# Patient Record
Sex: Female | Born: 1945
Health system: Southern US, Community
[De-identification: ages and names within clinical notes are randomized; demographics above are authoritative.]

## PROBLEM LIST (undated history)

## (undated) DIAGNOSIS — E119 Type 2 diabetes mellitus without complications: Secondary | ICD-10-CM

## (undated) HISTORY — DX: Type 2 diabetes mellitus without complications: E11.9

## (undated) HISTORY — PX: ABDOMINAL HYSTERECTOMY: SHX81

---

## 1999-06-09 ENCOUNTER — Encounter (INDEPENDENT_AMBULATORY_CARE_PROVIDER_SITE_OTHER): Payer: Self-pay | Admitting: Specialist

## 1999-06-09 ENCOUNTER — Ambulatory Visit (HOSPITAL_COMMUNITY): Admission: RE | Admit: 1999-06-09 | Discharge: 1999-06-09 | Payer: Self-pay | Admitting: Internal Medicine

## 2000-05-16 ENCOUNTER — Encounter: Admission: RE | Admit: 2000-05-16 | Discharge: 2000-05-16 | Payer: Self-pay | Admitting: Obstetrics and Gynecology

## 2000-05-16 ENCOUNTER — Encounter: Payer: Self-pay | Admitting: Obstetrics and Gynecology

## 2001-05-22 ENCOUNTER — Encounter: Payer: Self-pay | Admitting: Obstetrics and Gynecology

## 2001-05-22 ENCOUNTER — Encounter: Admission: RE | Admit: 2001-05-22 | Discharge: 2001-05-22 | Payer: Self-pay | Admitting: Family Medicine

## 2001-11-14 ENCOUNTER — Inpatient Hospital Stay (HOSPITAL_COMMUNITY): Admission: RE | Admit: 2001-11-14 | Discharge: 2001-11-16 | Payer: Self-pay | Admitting: Gynecology

## 2001-11-14 ENCOUNTER — Encounter (INDEPENDENT_AMBULATORY_CARE_PROVIDER_SITE_OTHER): Payer: Self-pay | Admitting: Specialist

## 2002-05-24 ENCOUNTER — Encounter: Payer: Self-pay | Admitting: Gynecology

## 2002-05-24 ENCOUNTER — Encounter: Admission: RE | Admit: 2002-05-24 | Discharge: 2002-05-24 | Payer: Self-pay | Admitting: Gynecology

## 2003-01-23 ENCOUNTER — Other Ambulatory Visit: Admission: RE | Admit: 2003-01-23 | Discharge: 2003-01-23 | Payer: Self-pay | Admitting: Gynecology

## 2003-06-06 ENCOUNTER — Encounter: Admission: RE | Admit: 2003-06-06 | Discharge: 2003-06-06 | Payer: Self-pay | Admitting: Gynecology

## 2003-06-06 ENCOUNTER — Encounter: Payer: Self-pay | Admitting: Gynecology

## 2004-06-25 ENCOUNTER — Encounter: Admission: RE | Admit: 2004-06-25 | Discharge: 2004-06-25 | Payer: Self-pay | Admitting: Gynecology

## 2005-01-04 ENCOUNTER — Other Ambulatory Visit: Admission: RE | Admit: 2005-01-04 | Discharge: 2005-01-04 | Payer: Self-pay | Admitting: Gynecology

## 2005-07-06 ENCOUNTER — Encounter: Admission: RE | Admit: 2005-07-06 | Discharge: 2005-07-06 | Payer: Self-pay | Admitting: Gynecology

## 2006-02-18 ENCOUNTER — Ambulatory Visit: Payer: Self-pay | Admitting: Internal Medicine

## 2006-07-12 ENCOUNTER — Encounter: Admission: RE | Admit: 2006-07-12 | Discharge: 2006-07-12 | Payer: Self-pay | Admitting: Gynecology

## 2007-07-17 ENCOUNTER — Other Ambulatory Visit: Admission: RE | Admit: 2007-07-17 | Discharge: 2007-07-17 | Payer: Self-pay | Admitting: Gynecology

## 2007-07-19 ENCOUNTER — Encounter: Admission: RE | Admit: 2007-07-19 | Discharge: 2007-07-19 | Payer: Self-pay | Admitting: Gynecology

## 2008-07-19 ENCOUNTER — Encounter: Admission: RE | Admit: 2008-07-19 | Discharge: 2008-07-19 | Payer: Self-pay | Admitting: Gynecology

## 2009-07-21 ENCOUNTER — Encounter: Admission: RE | Admit: 2009-07-21 | Discharge: 2009-07-21 | Payer: Self-pay | Admitting: Gynecology

## 2009-09-11 ENCOUNTER — Other Ambulatory Visit: Admission: RE | Admit: 2009-09-11 | Discharge: 2009-09-11 | Payer: Self-pay | Admitting: Internal Medicine

## 2009-09-11 ENCOUNTER — Ambulatory Visit: Payer: Self-pay | Admitting: Internal Medicine

## 2009-09-16 ENCOUNTER — Encounter: Admission: RE | Admit: 2009-09-16 | Discharge: 2009-09-16 | Payer: Self-pay | Admitting: Internal Medicine

## 2009-10-14 ENCOUNTER — Ambulatory Visit: Payer: Self-pay | Admitting: Internal Medicine

## 2010-09-01 ENCOUNTER — Encounter: Admission: RE | Admit: 2010-09-01 | Discharge: 2010-09-01 | Payer: Self-pay | Admitting: Internal Medicine

## 2010-12-23 ENCOUNTER — Encounter: Payer: Self-pay | Admitting: Internal Medicine

## 2010-12-23 ENCOUNTER — Ambulatory Visit (INDEPENDENT_AMBULATORY_CARE_PROVIDER_SITE_OTHER): Payer: BC Managed Care – PPO | Admitting: Internal Medicine

## 2010-12-23 DIAGNOSIS — K089 Disorder of teeth and supporting structures, unspecified: Secondary | ICD-10-CM

## 2010-12-23 DIAGNOSIS — H6120 Impacted cerumen, unspecified ear: Secondary | ICD-10-CM

## 2010-12-23 DIAGNOSIS — J309 Allergic rhinitis, unspecified: Secondary | ICD-10-CM | POA: Insufficient documentation

## 2010-12-23 DIAGNOSIS — J069 Acute upper respiratory infection, unspecified: Secondary | ICD-10-CM | POA: Insufficient documentation

## 2010-12-25 ENCOUNTER — Telehealth (INDEPENDENT_AMBULATORY_CARE_PROVIDER_SITE_OTHER): Payer: Self-pay | Admitting: *Deleted

## 2010-12-29 NOTE — Assessment & Plan Note (Signed)
Summary: new acute/sinus//ok per kelly/kn   Vital Signs:  Patient profile:   65 year old female Height:      66.5 inches Weight:      183.2 pounds BMI:     29.23 Temp:     98.9 degrees F oral Pulse rate:   96 / minute Resp:     14 per minute BP sitting:   112 / 72  (left arm) Cuff size:   large  Vitals Entered By: Shonna Chock CMA (December 23, 2010 2:23 PM) CC: New Acute: Sinuses, patient to return for Establish/CPX appointment , URI symptoms   CC:  New Acute: Sinuses, patient to return for Establish/CPX appointment , and URI symptoms.  History of Present Illness:    Onset  as intermittent  pressure  in L ear  beginning several weeks ago followed by L  jaw pain. As of 12/21/2010   she develpoed  rhinitis; now she  reports clear nasal discharge and sore throat( last night), but denies productive cough.  Associated symptoms include low-grade fever (<100.5 degrees last night )also.  The patient denies dyspnea and wheezing.  The patient denies headache, bilateral nasal discharge and tender adenopathy. The jaw pain is localized to both the L TMJ & L mid mandible.  Rx: Sucrets  Preventive Screening-Counseling & Management  Alcohol-Tobacco     Smoking Status: never  Caffeine-Diet-Exercise     Does Patient Exercise: no  Allergies (verified): No Known Drug Allergies  Past History:  Past Medical History: Allergic rhinitis  Past Surgical History:  G 2 P 2 ; Hysterectomy for dysfunctional bleeding 2002, Dr Nicholas Lose  Family History: Father: diffuse alveolar lung disease following lung injury in MVA Mother: died age 43  Social History: Married Never Smoked Alcohol use-yes: socially Regular exercise-no Smoking Status:  never Does Patient Exercise:  no  Physical Exam  General:  well-nourished,in no acute distress; alert,appropriate and cooperative throughout examination Eyes:  No corneal or conjunctival inflammation noted. EOMI.  Vision grossly normal. Ears:  R ear normal and  L Cerumen impaction.  After gavage L TM clear & hearing normal to whisper. Nose:  External nasal examination shows no deformity but mild  inflammation. Nasal mucosa are pink and moist without lesions or exudates. Mouth:  Oral mucosa and oropharynx without lesions or exudates.  Teeth in good repair. Slight discomfort L TMJ with mastication Lungs:  Normal respiratory effort, chest expands symmetrically. Lungs are clear to auscultation, no crackles or wheezes. Heart:  Normal rate and regular rhythm. S1 and S2 normal without gallop, murmur, click, rub . S4 Cervical Nodes:  No lymphadenopathy noted Axillary Nodes:  No palpable lymphadenopathy   Impression & Recommendations:  Problem # 1:  CERUMEN IMPACTION, LEFT (ICD-380.4)  Problem # 2:  URI (ICD-465.9)  Problem # 3:  DENTAL PAIN (ICD-525.9) ? component of TMJ vs dental root infection  Complete Medication List: 1)  Vitamin D 1000 Unit Tabs (Cholecalciferol) .Marland Kitchen.. 1 by mouth once daily 2)  Citracal Plus Tabs (Multiple minerals-vitamins) .Marland Kitchen.. 1 by mouth once daily 3)  Amoxicillin 500 Mg Caps (Amoxicillin) .Marland Kitchen.. 1 three times a day  Patient Instructions: 1)  Soft diet & Aleve 1-2 every 8-12 hrs as needed for TMJ  jaw pain. Warm , moist compresses up to three times a day as needed . Prescriptions: AMOXICILLIN 500 MG CAPS (AMOXICILLIN) 1 three times a day  #30 x 0   Entered and Authorized by:   Marga Melnick MD   Signed by:  Marga Melnick MD on 12/23/2010   Method used:   Electronically to        [id number] (retail)       [address]       [place], [place]  [address]       Ph: [phone] or [phone]       Fax: [phone]   RxID:   [id number]    Orders Added: 1)  New Patient Level III [phone]

## 2010-12-29 NOTE — Progress Notes (Signed)
Summary: Triage: Cough  Phone Note Call from Patient Call back at Home Phone [phone] Call back at Work Phone [phone] Call back at [phone]   Caller: Patient Summary of Call: Patient would like a cough med call in because she has lost her voice from coughing all night last and Pharm--[facility] on [address] Initial call taken by: Freddy Jaksch,  December 25, 2010 1:08 PM  Follow-up for Phone Call        Dr.Hopper please advise  Follow-up by: Shonna Chock CMA,  December 25, 2010 3:28 PM  Additional Follow-up for Phone Call Additional follow up Details #1::        Per Dr.Hopper     New/Updated Medications: HYDROMET 5-1.5 MG/5ML SYRP (HYDROCODONE-HOMATROPINE) 1 teaspoon every 6 hours as needed Prescriptions: HYDROMET 5-1.5 MG/5ML SYRP (HYDROCODONE-HOMATROPINE) 1 teaspoon every 6 hours as needed  #120cc x 0   Entered by:   Shonna Chock CMA   Authorized by:   Marga Melnick MD   Signed by:   Shonna Chock CMA on 12/25/2010   Method used:   Print then Give to Patient   RxID:   [id number]

## 2011-01-27 ENCOUNTER — Telehealth: Payer: Self-pay | Admitting: Internal Medicine

## 2011-01-27 NOTE — Telephone Encounter (Signed)
Hop please advise for any other suggestions pt was seen 2/22

## 2011-01-27 NOTE — Telephone Encounter (Signed)
Spoke w/ pt aware of information appt scheduled to f/u

## 2011-01-27 NOTE — Telephone Encounter (Signed)
Patient says she saw Dr Alwyn Ren about a month ago for the cold and cough---she was given a prescription for a cough medicine---got better, but cough has come back---has been taking the cough medicine that Dr Alwyn Ren prescribed, but she feels like she needs an antibiotic this time.    Please call one into [facility], [address], [place]   In addition to home number [phone]) patient can also be reached on cell = [phone]

## 2011-01-27 NOTE — Telephone Encounter (Signed)
I reviewed the office visit from February 28. She was given an antibiotic at that time in addition to the cough medicine. The recurrence of symptoms is worrisome for reactive airways disease or variant  of asthma. This has been a common phenomena particularly with the recent pollen exposures. I recommend that she be reevaluated to see if an antibiotic is appropriate. She may need inhaled agents for reactive airways disease.

## 2011-01-28 ENCOUNTER — Encounter: Payer: Self-pay | Admitting: Internal Medicine

## 2011-01-29 ENCOUNTER — Encounter: Payer: Self-pay | Admitting: Internal Medicine

## 2011-01-29 ENCOUNTER — Ambulatory Visit (INDEPENDENT_AMBULATORY_CARE_PROVIDER_SITE_OTHER): Payer: BC Managed Care – PPO | Admitting: Internal Medicine

## 2011-01-29 VITALS — BP 114/80 | HR 76 | Temp 98.4°F | Wt 182.0 lb

## 2011-01-29 DIAGNOSIS — J209 Acute bronchitis, unspecified: Secondary | ICD-10-CM

## 2011-01-29 MED ORDER — AZITHROMYCIN 250 MG PO TABS: 250.0000 mg | ORAL_TABLET | Freq: Every day | ORAL | Status: AC

## 2011-01-29 NOTE — Progress Notes (Signed)
  Subjective:    Patient ID: Natalie [name], female    DOB: 26-Feb-1946, 65 y.o.   MRN: [id number]  HPI She started having a nonproductive cough on 01/23/2011. Intermittently she will have some clear sputum; there's been no purulent secretions. The major and minor symptoms of rhinosinusitis were reviewed. These include nasal congestion/obstruction; nasal purulence; facial pain; anosmia; fatigue; fever; headache; halitosis; earache and dental pain. None of these were present. Duration of symptoms  as well as treatment to date were verified.  She did have some itchy eyes and sneezing which responded to Sudafed.  She is a nonsmoker and  has no history of asthma. .     Review of Systems     Objective:   Physical Exam She appears healthy; she is in no distress.  There is no evidence of conjunctivitis  Nares are patent; there is no exudate. External nose is slightly erythematous.  Oropharynx is unremarkable. She has no exudate. Dental hygiene is good.  She has no lymphadenopathy about the neck or axilla.  Chest is essentially clear with only minimal rales. There is no wheezing or rhonchi noted. She does have intermittent dry cough.          Assessment & Plan:  #1 she has no evidence of purulent bronchitis; she possibly could have an atypical bacterial  infection with Mycoplasma.,Legionella, or non venereal Chlamydia.  She did have some extrinsic allergic symptoms; surprisingly these improved with a decongestant antihistamine.  I am concerned about possible reactive airways component postinflammatory.  Plan: As she has taken amoxicillin which would not cover the atypicals, a course of azithromycin will be prescribed.  I will also provide her with a sample of Singulair 10 mg( #7) and Advair 100/50 to be used to treat any extrinsic or inflammatory component.

## 2011-01-29 NOTE — Patient Instructions (Signed)
Please use the samples as prescribed. Advair 100/50 to be used one inhalation every 12 hours. Gargle with water and spit after use. This is to treat inflammation in  the airways. Singulair 10 mg is  to be taken once daily. This will treat both allergic components and cough itself.

## 2011-02-18 ENCOUNTER — Ambulatory Visit
Admission: RE | Admit: 2011-02-18 | Discharge: 2011-02-18 | Disposition: A | Discharge status: Home / Self Care | Payer: BC Managed Care – PPO | Source: Ambulatory Visit | Attending: Internal Medicine | Admitting: Internal Medicine

## 2011-02-18 ENCOUNTER — Ambulatory Visit (INDEPENDENT_AMBULATORY_CARE_PROVIDER_SITE_OTHER): Payer: BC Managed Care – PPO | Admitting: Internal Medicine

## 2011-02-18 ENCOUNTER — Other Ambulatory Visit: Payer: Self-pay | Admitting: Internal Medicine

## 2011-02-18 DIAGNOSIS — K529 Noninfective gastroenteritis and colitis, unspecified: Secondary | ICD-10-CM

## 2011-02-19 ENCOUNTER — Inpatient Hospital Stay (HOSPITAL_COMMUNITY)
Admission: AD | Admit: 2011-02-19 | Discharge: 2011-02-22 | DRG: 180 | Disposition: A | Discharge status: Home / Self Care | Payer: BC Managed Care – PPO | Source: Ambulatory Visit | Attending: Internal Medicine | Admitting: Internal Medicine

## 2011-02-19 ENCOUNTER — Other Ambulatory Visit: Payer: Self-pay | Admitting: Internal Medicine

## 2011-02-19 ENCOUNTER — Ambulatory Visit (INDEPENDENT_AMBULATORY_CARE_PROVIDER_SITE_OTHER): Payer: BC Managed Care – PPO | Admitting: Internal Medicine

## 2011-02-19 ENCOUNTER — Ambulatory Visit
Admission: RE | Admit: 2011-02-19 | Discharge: 2011-02-19 | Disposition: A | Discharge status: Home / Self Care | Payer: BC Managed Care – PPO | Source: Ambulatory Visit | Attending: Internal Medicine | Admitting: Internal Medicine

## 2011-02-19 DIAGNOSIS — E876 Hypokalemia: Secondary | ICD-10-CM | POA: Diagnosis present

## 2011-02-19 DIAGNOSIS — R112 Nausea with vomiting, unspecified: Secondary | ICD-10-CM | POA: Diagnosis present

## 2011-02-19 DIAGNOSIS — A088 Other specified intestinal infections: Secondary | ICD-10-CM | POA: Diagnosis present

## 2011-02-19 DIAGNOSIS — K56609 Unspecified intestinal obstruction, unspecified as to partial versus complete obstruction: Secondary | ICD-10-CM

## 2011-02-19 DIAGNOSIS — R140 Abdominal distension (gaseous): Secondary | ICD-10-CM

## 2011-02-19 DIAGNOSIS — K5289 Other specified noninfective gastroenteritis and colitis: Secondary | ICD-10-CM

## 2011-02-19 DIAGNOSIS — K529 Noninfective gastroenteritis and colitis, unspecified: Secondary | ICD-10-CM

## 2011-02-19 LAB — DIFFERENTIAL
Eosinophils Absolute: 0.2 10*3/uL (ref 0.0–0.7)
Eosinophils Relative: 3 % (ref 0–5)
Lymphocytes Relative: 30 % (ref 12–46)
Monocytes Relative: 12 % (ref 3–12)
Neutro Abs: 3.9 10*3/uL (ref 1.7–7.7)

## 2011-02-19 LAB — COMPREHENSIVE METABOLIC PANEL
ALT: 45 U/L — ABNORMAL HIGH (ref 0–35)
AST: 44 U/L — ABNORMAL HIGH (ref 0–37)
Albumin: 3.7 g/dL (ref 3.5–5.2)
Alkaline Phosphatase: 72 U/L (ref 39–117)
CO2: 28 mEq/L (ref 19–32)
Creatinine, Ser: 0.78 mg/dL (ref 0.4–1.2)
GFR calc Af Amer: >60 mL/min (ref >60)
GFR calc non Af Amer: >60 mL/min (ref >60)
Sodium: 139 mEq/L (ref 135–145)
Total Bilirubin: 0.9 mg/dL (ref 0.3–1.2)

## 2011-02-19 LAB — URINALYSIS, ROUTINE W REFLEX MICROSCOPIC
Bilirubin Urine: NEGATIVE
Specific Gravity, Urine: 1.02 (ref 1.005–1.030)
Urobilinogen, UA: 0.2 mg/dL (ref 0.0–1.0)
pH: 7 (ref 5.0–8.0)

## 2011-02-19 LAB — URINE MICROSCOPIC-ADD ON

## 2011-02-19 LAB — CBC
HCT: 40.2 % (ref 36.0–46.0)
Hemoglobin: 13.8 g/dL (ref 12.0–15.0)
MCH: 29.2 pg (ref 26.0–34.0)
MCHC: 34.3 g/dL (ref 30.0–36.0)
MCV: 85 fL (ref 78.0–100.0)
Platelets: 238 10*3/uL (ref 150–400)

## 2011-02-19 LAB — AMYLASE: Amylase: 50 U/L (ref 0–105)

## 2011-02-19 MED ORDER — IOHEXOL 300 MG/ML  SOLN
100.0000 mL | Freq: Once | INTRAMUSCULAR | Status: AC | PRN
  Administered 2011-02-19: 100 mL via INTRAVENOUS

## 2011-02-20 ENCOUNTER — Observation Stay (HOSPITAL_COMMUNITY): Payer: BC Managed Care – PPO

## 2011-02-20 DIAGNOSIS — K529 Noninfective gastroenteritis and colitis, unspecified: Secondary | ICD-10-CM

## 2011-02-20 DIAGNOSIS — K56609 Unspecified intestinal obstruction, unspecified as to partial versus complete obstruction: Secondary | ICD-10-CM

## 2011-02-20 LAB — BASIC METABOLIC PANEL
BUN: 4 mg/dL — ABNORMAL LOW (ref 6–23)
Calcium: 8.8 mg/dL (ref 8.4–10.5)
Creatinine, Ser: 0.63 mg/dL (ref 0.4–1.2)
Glucose, Bld: 150 mg/dL — ABNORMAL HIGH (ref 70–99)
Sodium: 140 mEq/L (ref 135–145)

## 2011-02-20 LAB — CBC
HCT: 37.8 % (ref 36.0–46.0)
Platelets: 211 10*3/uL (ref 150–400)
WBC: 5.8 10*3/uL (ref 4.0–10.5)

## 2011-02-21 DIAGNOSIS — K56609 Unspecified intestinal obstruction, unspecified as to partial versus complete obstruction: Secondary | ICD-10-CM

## 2011-02-21 DIAGNOSIS — K529 Noninfective gastroenteritis and colitis, unspecified: Secondary | ICD-10-CM

## 2011-02-21 LAB — BASIC METABOLIC PANEL
BUN: 4 mg/dL — ABNORMAL LOW (ref 6–23)
CO2: 25 mEq/L (ref 19–32)
Calcium: 8.8 mg/dL (ref 8.4–10.5)
Chloride: 111 mEq/L (ref 96–112)
GFR calc Af Amer: >60 mL/min (ref >60)
GFR calc non Af Amer: >60 mL/min (ref >60)
Glucose, Bld: 129 mg/dL — ABNORMAL HIGH (ref 70–99)
Sodium: 142 mEq/L (ref 135–145)

## 2011-02-22 DIAGNOSIS — K529 Noninfective gastroenteritis and colitis, unspecified: Secondary | ICD-10-CM

## 2011-02-22 DIAGNOSIS — K56609 Unspecified intestinal obstruction, unspecified as to partial versus complete obstruction: Secondary | ICD-10-CM

## 2011-02-22 LAB — OVA AND PARASITE EXAMINATION: Ova and parasites: NONE SEEN

## 2011-02-22 NOTE — H&P (Signed)
Natalie [name], Natalie [name]           ACCOUNT NO.:  [id number]  MEDICAL RECORD NO.:  [id number]           PATIENT TYPE:  O  LOCATION:  1537                         FACILITY:  [facility]  PHYSICIAN:  Luanna Cole. Lenord Fellers, M.D.   DATE OF BIRTH:  1946/10/06  DATE OF ADMISSION:  02/19/2011 DATE OF DISCHARGE:                             HISTORY & PHYSICAL   CHIEF COMPLAINT:  Abdominal bloating.  HISTORY OF PRESENT ILLNESS:  This 65 year old white female, not seen here in the office since December 2010, came on February 18, 2011, with complaints of onset of nausea, vomiting, and diarrhea on Sunday night, April 15th.  The patient had multiple episodes of vomiting 3-4 times at least and several loose watery brown stools.  She has no travel history. No one else at home is sick.  She was out in public a lot previous week in church on Sunday April 15th.  She assumed she had a viral gastroenteritis, but was slow to respond.  Vomiting resolved within 24hours, but the patient remained weak and had persistent diarrhea.  The patient last had some antibiotics at the end of March consisting of Zithromax/Z-PAK.  This was prescribed by another physician.  She was seen in the office yesterday with complaint of abdominal bloating.  The abdomen was distended without rebound.  She was mildly tender in the lower abdomen below the umbilicus.  KUB was consistent with an early small bowel obstruction.  White blood cell count was 7000 and she was afebrile.  She was sent home on clear liquids and advised to follow up in the office today.  Today, she still has abdominal bloating and we obtained a CT of the abdomen and pelvis and CT was consistent with adhesive partial small bowel obstruction involving the ileum.  The patient has had a vaginal hysterectomy with posterior and enterocele repair in 2003.  Pathology at that time revealed chronic cervicitis  and squamous metaplasia.  She also had mucosal leiomyoma and  leiomyomata. She had a hysteroscopy and removal of uterine fibroids in 2000 by Dr. Winfred Burn as well.  ALLERGIES:  No known drug allergies.  PAST MEDICAL HISTORY: 1. History of anxiety, especially with flying, no chronic medications. 2. History of right shoulder adhesive capsulitis in 2010. 3. Broken foot in 1986. 4. No smoking history, social alcohol consumption. 5. Influenza immunization in November 2011.  PAST SURGICAL HISTORY:  Vaginal hysterectomy, posterior and enterocele repair in 2003 by Dr. Nicholas Lose, hysteroscopy and fibroid removal in 2000 by Dr. Winfred Burn.  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  The patient has a college degree, but has not worked outside the home since 1986.  Her husband owns [employer].  FAMILY HISTORY:  Father died of lung disease, described as "diffuse alveolar damage."  Mother died of pneumonia at age 70 with a history of hypertension and adult-onset diabetes.  One sister in good health.  PHYSICAL EXAMINATION:  VITAL SIGNS:  Temperature 98.9, pulse 80, blood pressure 116/78, weight 175 pounds. SKIN:  Warm and dry. NODES:  None. HEENT:  TMs and pharynx are clear. NECK:  Supple.  No thyromegaly. CHEST:  Clear.  ABDOMEN:  Distended, tympanic.  Bowel sounds are present.  No hepatosplenomegaly appreciated.  She is tender below the umbilicus without rebound tenderness. EXTREMITIES:  No deformity.  No edema. NEURO:  No gross focal deficits on brief neurological exam.  LABORATORY DATA:  White blood cell count 5300, hemoglobin 14.3 g, potassium 3.0.  IMPRESSION: 1. Partial small bowel obstruction. 2. Gastroenteritis. 3. Hypokalemia.  PLAN:  The patient will receive IV fluids with potassium.  Surgical consultation will be obtained.  Potassium will improve with IV fluid hydration.     Luanna Cole. Lenord Fellers, M.D.     MJB/MEDQ  D:  02/21/2011  T:  02/22/2011  Job:  [id number]  Electronically Signed by Marlan Palau M.D. on  02/22/2011 04:26:02 PM

## 2011-02-23 LAB — STOOL CULTURE

## 2011-02-23 NOTE — Consult Note (Signed)
NAMEEMMERSON, SHUFFIELD           ACCOUNT NO.:  [id number]  MEDICAL RECORD NO.:  [id number]           PATIENT TYPE:  O  LOCATION:  1537                         FACILITY:  [facility]  PHYSICIAN:  Angelia Mould. Derrell Lolling, M.D.DATE OF BIRTH:  June 09, 1946  DATE OF CONSULTATION:  02/19/2011 DATE OF DISCHARGE:                                CONSULTATION   REQUESTING PHYSICIAN/PRIMARY CARE PHYSICIAN:  Dr. Eden Emms Baxley  CONSULTING SURGEON:  Angelia Mould. Derrell Lolling, MD  REASON FOR CONSULTATION:  Partial small bowel obstruction versus gastroenteritis.  HISTORY OF PRESENT ILLNESS:  [name] is a 65 year old white female with no past medical history who developed nausea and vomiting and diarrhea on Sunday night.  She states the nausea and vomiting stopped on Monday, however, she continued to have diarrhea almost every time immediately after she would eat or drink anything.  The patient denied any sick contacts, hospital or nursing home contacts.  Sunday, she did eat at banquet however, no one else was sick after eating food there. She describes her diarrhea is very malodorous, more so than normal as well as watery.  She states she thinks she had a fairly high fever on Sunday, but did not check it.  She did have a low-grade fever at her primary care physician's office yesterday per her husband.  She occasionally had chills at home.  The patient admits to passing some flatus, but felt like she needs to pass more and is unable to.  Because of continued diarrhea, the patient presented to the primary care doctor's office yesterday.  At this time, a CBC was checked which was unremarkable.  A repeat CBC was checked this morning which was also unremarkable, but a CT scan was ordered.  The CT scan revealed findings compatible with mild adhesive partial small bowel obstruction, involving the ileum in the anatomic pelvis.  The colon appears to have no mural thickening, but does have liquid stool present  which is abnormal, but nonspecific.  Because of this finding, the patient was asked to present to [facility] for direct admission.  We were asked to evaluate the patient for CT scan findings of possible bowel obstruction.  REVIEW OF SYSTEMS:  Please see HPI.  Otherwise all other systems have been reviewed and are negative.  The patient does admit to mild central abdominal pain, but otherwise unremarkable.  FAMILY HISTORY:  Noncontributory.  PAST MEDICAL HISTORY:  None.  PAST SURGICAL HISTORY:  Vaginal hysterectomy with no abdominal surgeries.  SOCIAL HISTORY:  The patient is married.  She has 2 children.  She denies any tobacco, but does admit to occasional alcohol use.  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  ALLERGIES:  NKDA.  MEDICATIONS:  Citracal and vitamin D3.  PHYSICAL EXAMINATION:  GENERAL:  [name] is a very pleasant 62- year-old white female who is currently sitting up in bed, well developed, well nourished, in no acute distress. VITAL SIGNS:  Have yet to be obtained as the patient just arrived on the floor. HEENT:  Head is normocephalic, atraumatic.  Sclerae noninjected.  Pupils are equal, round, and reactive to light.  Ears and nose are without any obvious masses or lesions.  No rhinorrhea.  Mouth is pink.  Throat shows no exudate. HEART:  Regular rate and rhythm.  Normal S1 and S2.  No murmurs, gallops, or rubs are noted.  She does have palpable carotid, radial, and pedal pulses bilaterally. LUNGS:  Clear to auscultation bilaterally with no wheezes, rhonchi, or rales noted.  Respiratory effort is nonlabored. ABDOMEN:  Soft with mild distention.  She does have some tympany and is mildly tender in the central portion of her abdomen.  She does have a few active bowel sounds, but does not have any rebound tenderness or peritoneal signs.  The patient does not have any abdominal scars, masses, hernias, or organomegaly. SKIN:  Warm and dry  with no masses, lesions, or rashes. PSYCHIATRIC:  The patient is alert and oriented x3 with appropriate affect.  LABORATORY DATA AND DIAGNOSTICS:  White blood cell count is 5300, hemoglobin 14.3, hematocrit 40.0, platelet count is 214,000.  CT scan of the abdomen and pelvis reveals findings consistent with mild adhesive partial small bowel obstruction, involving the ileum in the anatomic pelvis.  It also notes liquid stool present in the colon without mural thickening which is abnormal, but nonspecific.  IMPRESSION: 1. Likely gastroenteritis, rule out Clostridium difficile colitis. 2. Possible partial small bowel obstruction, felt to be less likely.  PLAN:  The patient has not had any abdominal surgeries in the past, so it is more doubtful that the patient has a partial small bowel obstruction secondary to adhesive disease.  The patient is also currently without any nausea or vomiting and is tolerating food just with subsequent significant malodorous watery diarrhea.  Because of this, we will check a Clostridium difficile PCR to rule out Clostridium difficile colitis as a possible cause for the patient's ongoing diarrhea.  Otherwise if this is negative, it is likely the patient has gastroenteritis and therefore we agree with the patient being placed on IV fluids as well as an n.p.o. status currently for bowel rest.  We will follow this patient along with you.  Thank you for this consultation.     Letha Cape, PA   ______________________________ Angelia Mould. Derrell Lolling, M.D.    KEO/MEDQ  D:  02/19/2011  T:  02/20/2011  Job:  [id number]  cc:   Dr. Eden Emms Baxley  Electronically Signed by Barnetta Chapel PA on 02/22/2011 02:35:15 PM Electronically Signed by Claud Kelp M.D. on 02/23/2011 10:06:02 AM

## 2011-03-01 ENCOUNTER — Ambulatory Visit (INDEPENDENT_AMBULATORY_CARE_PROVIDER_SITE_OTHER): Payer: BC Managed Care – PPO | Admitting: Internal Medicine

## 2011-03-01 DIAGNOSIS — K529 Noninfective gastroenteritis and colitis, unspecified: Secondary | ICD-10-CM

## 2011-03-01 DIAGNOSIS — E876 Hypokalemia: Secondary | ICD-10-CM

## 2011-03-19 NOTE — Discharge Summary (Signed)
[facility]  Patient:    Natalie [name], Natalie [name] Visit Number: [id number] MRN: [id number]          Service Type: Attending:  Gretta Cool, M.D. Dictated by:   Jeani Sow, F.N.P. Adm. Date:  11/14/01 Disc. Date: 11/16/01                             Discharge Summary  HISTORY OF PRESENT ILLNESS:  Natalie [name] is a 65 year old female, G2, P2 referred to our office by Dr. Christie Beckers for evaluation of enlarging uterine leiomyomata.  She reports cyclic menses lasting five to seven days with extremely heavy flow.  She had a history of hysteroscopy and resection of fibroids in 2000, by Dr. Katrinka Blazing.  Recent ultrasound examination noted large, luminal fibroid bulging into the cavity and a smaller, what appeared to be polyp bulging into the cavity as well.  She has very poor pelvic support with posterior and central fascia defect as well as right levator plate weakness, detachment of the perineal body and rotational descent of the anus with straining.  She is now admitted for hysterectomy and repairs.  She also realizes that there may be need for salpingo-oophorectomy.  PHYSICAL EXAMINATION:  CHEST:  Clear to A&P.  HEART:  Regular rate and rhythm without murmur, gallop or cardiac enlargement.  ABDOMEN:  Soft and scaphoid without masses or organomegaly.  PELVIC:  External genitalia within normal limits for female.  Vagina is clear and rugous.  Cervix is parous and clean.  The uterus is approximately eight weeks in size, grossly irregular with leiomyomata palpable.  Adnexa bilaterally clear.  Rectovaginal exam confirms.  It is noted that she has extremely poor posterior pelvic support with central and transverse perirectal fascial defect.  She has right levator plate tear and detachment with rotational descent of the anus with straining.  IMPRESSION: 1. Luminal leiomyomata with abnormal uterine bleeding, recurrent. 2. Posthysteroscopy resection in 2000, by  Dr. Katrinka Blazing. 3. Poor posterior pelvic support with rectocele and enterocele grade 3. 4. Levator plate tear. 5. Extreme phobia of surgery.  PLAN:  Vaginal hysterectomy, posterior and enterocele repair with suprapubic catheter under general anesthesia.  Risks and benefits have been discussed with the patient and she accepts these procedures.  LABORATORY DATA AND X-RAY FINDINGS:  Admission hemoglobin 12.8, hematocrit 36.5 on postop day #1.  Hemoglobin 8.6, hematocrit 25.9.  EKG with normal sinus rhythm.  HOSPITAL COURSE:  The patient underwent vaginal hysterectomy with posterior and enterocele repairs under general anesthesia.  The procedure was completed without any complications and the patient was returned to the recovery room in excellent condition.  Pathology report revealed chronic cervicitis with squamous metaplasia, benign weakly proliferative endometrium with a mucosal leiomyoma and leiomyomata.  Her postoperative course was complicated with rectal pressure on postop day #2.  The catheter was opened due to her voiding small amounts.  On postop day #2, she was voiding and emptying without difficulty.  The suprapubic catheter was removed.  She had a BM that day after an enema and the rectal pressure was relieved.  She was discharged on postop day #2 in excellent condition.  ACTIVITY:  No heavy lifting or straining, no vaginal entrance and increase ambulation as tolerated.  SPECIAL INSTRUCTIONS:  She is to call for any fever over 100.5 or failure of daily improvement.  DIET:  Regular.  DISCHARGE MEDICATIONS: 1. Vioxx 25 mg daily. 2. Tylox one p.o. q.2-4h. p.r.n. discomfort. 3.  Suppository and enema as needed.  FOLLOWUP:  She is to return to the office in one week for followup.  CONDITION ON DISCHARGE:  Excellent.  DISCHARGE DIAGNOSES: 1. Uterine leiomyomata luminal with abnormal bleeding, recurrent with    posthysteroscopy and resection elsewhere in 2000. 2. Rectocele  and enterocele grade 3. 3. Levator plate tear with rotational descent of the anus.  PROCEDURES: 1. Vaginal hysterectomy. 2. Posterior and enterocele repairs under general anesthesia. Dictated by:   Jeani Sow, F.N.P. Attending:  Gretta Cool, M.D. DD:  12/25/01 TD:  12/25/01 Job: [id number]

## 2011-03-19 NOTE — H&P (Signed)
[facility]  Patient:    Natalie [name], Natalie [name] Visit Number: [id number] MRN: [id number]          Service Type: GYN Location: 1S X002 01 Attending Physician:  Katrina Stack Dictated by:   Gretta Cool, M.D. Admit Date:  11/14/2001   CC:         Esmeralda Arthur, M.D.   History and Physical  CHIEF COMPLAINT:  Fibroids with abnormal bleeding and pelvic support problems.  HISTORY OF PRESENT ILLNESS:  A 65 year old gravida 2, para 2, referred by Dr. Christie Beckers for evaluation of enlarging leiomyomata.  She continues to have cyclic menses lasting five to seven days with extremely heavy flow.  She has a history of hysteroscopy and resection of fibroids in 2000 by Dr. Katrinka Blazing. On recent ultrasound examination she was noted to have a large luminal fibroid bulging into the cavity and also a smaller, what appears to be polyp bulging into the cavity.  She also has very poor pelvic support with posterior and central fascial weakness, with a right levator plate weakness, detachment of the perineal body, and rotational descent of the anus with straining.  She is now admitted for hysterectomy.  She understands the risk/benefit ratio of hysteroscopy repeat, hysterectomy with repairs, no therapy, and all other alternatives.  Now she wishes to proceed to hysterectomy and possible salpingo-oophorectomy and to posterior and enterocele repairs.  PAST MEDICAL HISTORY:  Usual childhood diseases without sequelae.  Medical illnesses:  None of consequence.  Accidents/injuries:  None.  PAST SURGICAL HISTORY:  D&C, hysteroscopy by Dr. Katrinka Blazing in 2000.  Childbirth x2 in [place].  No other hospital admissions.  FAMILY HISTORY:  Father died of diffuse lung disease.  Mother has adult onset diabetes.  No other familial tendency.  One sister, living nondistended well.  SOCIAL HISTORY:  Social ethanol.  Denies tobacco or recreational drugs. REVIEW OF SYSTEMS:   HEENT:  Denies symptoms.  CARDIORESPIRATORY:  Denies asthma, cough, bronchitis, shortness of breath.  GASTROINTESTINAL/ GENITOURINARY:  Denies frequency, urgency, dysuria, change in bowel habits, food intolerance.  PHYSICAL EXAMINATION:  GENERAL:  A well-developed, well-nourished white female at 160 pounds, 5 feet 5 inches tall.  HEENT:  Pupils equally react to light and accommodate.  Fundi are benign.  The oropharynx clear.  NECK:  Supple without mass, thyroid enlargement.  CHEST:  Clear P to A.  CARDIAC:  Regular rhythm without murmur or cardiac enlargement.  ABDOMEN:  Soft, scaphoid, without mass or organomegaly.  PELVIC:  External genitalia normal female.  Vagina clean, rugous.  Cervix is parous, clean.  The uterus is approximately eight weeks size, grossly irregular, with leiomyomata palpable.  Adnexa clear.  Rectovaginal confirms. Note she has extremely poor posterior pelvic support with central and transverse perirectal fascial defects.  She has a right levator plate tear and detachment with rotational descent of the anus with straining.  She is now admitted for hysterectomy vaginally and repairs.  IMPRESSION: 1. Luminal leiomyomata with abnormal uterine bleeding, recurrent, post    hysteroscopy resection 2000 by Dr. Katrinka Blazing. 2. Poor posterior pelvic support with rectocele and enterocele, grade 3. 3. Levator plate tear. 4. Extreme phobia of surgery.  PLAN:  Vaginal hysterectomy, posterior and enterocele repair, suprapubic catheter. Dictated by:   Gretta Cool, M.D. Attending Physician:  Katrina Stack DD:  11/14/01 TD:  11/14/01 Job: [id number]

## 2011-03-19 NOTE — Op Note (Signed)
[facility]  Patient:    Natalie [name], Natalie [name] Visit Number: [id number] MRN: [id number]          Service Type: Attending:  Gretta Cool, M.D. Dictated by:   Gretta Cool, M.D. Proc. Date: 11/14/01   CC:         Esmeralda Arthur, M.D.   Operative Report  PREOPERATIVE DIAGNOSES: 1. Uterine leiomyomata luminal with abnormal uterine bleeding, recurrent, post    hysteroscopy and resection elsewhere in 2002. 2. Rectocele and enterocele, grade 3. 3. Levator plate tear with rotational descent of the anus.  POSTOPERATIVE DIAGNOSES: 1. Uterine leiomyomata luminal with abnormal uterine bleeding, recurrent, post    hysteroscopy and resection elsewhere in 2002. 2. Rectocele and enterocele, grade 3. 3. Levator plate tear with rotational descent of the anus.  PROCEDURES: 1. Vaginal hysterectomy. 2. Posterior and enterocele repairs.  SURGEON:  Gretta Cool, M.D.  ASSISTANT:  Raynald Kemp, M.D.  DESCRIPTION OF PROCEDURE:  Under excellent general anesthesia with the patient prepped and draped in the lithotomy position, the cervix was grasped with a single-tooth tenaculum and pulled down into view.  The mucosa was then circumcised and pushed off of the lower uterine segment.  The cul-de-sac was then entered and the self-retaining retractor placed in the cul-de-sac.  The uterosacral ligaments were then clamped, cut, sutured, and tied with 0 Vicryl. The cardinal ligaments were then also clamped, cut, sutured, and tied with 0 Vicryl.  The anterior peritoneum was then entered and a Deaver placed beneath the bladder.  The uterine vessels were then clamped, cut, sutured, and tied with 0 Vicryl.  The uterus was then inverted.  Because of its size, it could not be brought through the cul-de-sac.  A wedge resection was taken out of the posterior wall so as to reduce the uterine mass.  Multiple luminal leiomyomata were noted upon doing so.  The upper  pedicles were then clamped with Heaney clamps, cut, sutured, and tied with 0 Vicryl.  A second free tie was used to doubly ligate the infundibulopelvic.  At this point, the pedicles were examined and all were dry.  The peritoneum was then closed from the anterior peritoneum, lateral pedicles, and posterior cul-de-sac with 0 Monocryl.  High ligation of enterocele was then performed.  At this point, the uterosacral ligaments were identified and then sutured with 0 Ethibond as far up posteriorly as possible.  The cardinal ligaments were then also sutured and the anterior and posterior cuff fascia at the thickness portion was secured so as to elevate the angle of the vagina and prevent subsequent prolapse.  The cuff was then closed with a running subcuticular closure of 2-0 Vicryl so as to approximate the perirectal and endopelvic fascia as closely as possible.  At this point, attention was turned to the posterior repair.  The mucosa was incised at the introitus and the mucosa undermined to the apex of the vaginal cuff.  The mucosa was then reflected from the incised vaginal mucosa with blunt and sharp dissection.  The cut edges were held by Allis clamps for retraction.  At this point, the uterosacral ligament was again identified and sutures placed as far possible up the uterosacral.  The detached transverse fascial tear of perirectal fascia was then secured to the uterosacral ligament with a series of sutures of 0 Ethibond.  It was also secured to the vaginal cuff.  The central portion of the cuff of the perirectal fascia was then secured to the cuff  with interrupted sutures of 2-0 Ethibond.  At this point, the right levator plate tear was repaired with interrupted sutures of 0 Vicryl.  The mucosa was then trimmed and the upper layers of endopelvic fascia and mucosa closed with a running suture of 2-0 Vicryl.  The perineal body muscles were then reapproximated with interrupted sutures of 2-0  Vicryl and the skin was closed with subcuticular closure of 2-0 Vicryl.  At this point the procedure was terminated without complication.  The Bonnano suprapubic Cystocath was placed after approximately 400 cc distention of the bladder and secured with 0 Ethibond.  At the end of the procedure, the sponge and lap counts were correct.  No complications.  The patient returned to the recovery room in excellent condition. Dictated by:   Gretta Cool, M.D. Attending:  Gretta Cool, M.D. DD:  11/14/01 TD:  11/14/01 Job: [id number]

## 2011-04-01 NOTE — Discharge Summary (Signed)
NAMECLESSIE, KARRAS           ACCOUNT NO.:  [id number]  MEDICAL RECORD NO.:  [id number]           PATIENT TYPE:  I  LOCATION:  1537                         FACILITY:  [facility]  PHYSICIAN:  Luanna Cole. Lenord Fellers, M.D.   DATE OF BIRTH:  08-31-1946  DATE OF ADMISSION:  02/19/2011 DATE OF DISCHARGE:  02/22/2011                              DISCHARGE SUMMARY   FINAL DIAGNOSES: 1. Partial small-bowel obstruction. 2. Gastroenteritis - probably viral in etiology.  CONSULTANTS:  Angelia Mould. Derrell Lolling, M.D., General Surgery.  MEDICATIONS:  No chronic meds.  INSTRUCTIONS:  The patient is to hold vitamins and other supplements for 2 weeks after discharge.  She is to advance her diet slowly, currently is on soft diet and may advance to regular diet over the next 7-10 days. The patient is to follow up with Dr. Lenord Fellers in 1 week after discharge and is to follow up for an appointment.  The patient is discharged in stable and improved condition.  BRIEF HISTORY:  A 65 year old white female generally healthy, not seen in the office since December 2010, came into the office February 18, 2011 with history of gastroenteritis-type illness, onset on February 14, 2011 that had persisted with diarrhea but started initially with nausea, vomiting and diarrhea.  The patient presented with abdominal bloating. Findings were suspicious for bowel obstruction and she was sent for KUB, flat and upright film and CBC.  White blood cell count was normal.  KUB revealed a possible early partial small-bowel obstruction.  She was put on clear liquids and told to come back the following day.  On February 19, 2011, the patient had not improved significantly with regards to abdominal bloating and distention.  She had some mild pain below her umbilicus with no rebound tenderness.  Once again, white blood cell count was normal.  She was subsequently to the hospital for IV fluid hydration and surgical consultation after obtaining an  outpatient CT that concurred with the KUB findings.  For additional history, please see dictated history and physical.  She did have hysterectomy with posterior enterocele repair in 2003 and had hysteroscopy with removal of uterine fibroids in 2000.  These were the only abdominal surgeries that we have history for.  Takes no chronic medications.  HOSPITAL COURSE:  The patient was placed on the general surgery floor and given IV fluids consisting of D5 one half normal saline with 20 mEq KCl per liter.  Initially, 125 cc per hour.  On February 21, 2011, IV fluids were increased to 150 cc an hour for 6 hours because of deficit in I's and O's.  IV fluids were subsequently decreased back to 125 cc per hour.  She had mild hypokalemia consistent with a potassium of 3.0 which was repleted orally.  The patient slowly improved over her hospitalization.  She was able to pass gas.  She did have several episodes of diarrhea early Sunday Morning, February 21, 2011 and had 2 episodes on February 22, 2011.  Stool cultures are pending but revealed no suspicious colonies.  Stool for C difficile is negative.  The patient initially was placed on enteric precautions but these have been  removed at the time of discharge.  It is my feeling that she has had a viral gastroenteritis with subsequent partial small-bowel obstruction. Surgeon has felt that no surgery was necessary at this point in time. Her abdomen became much less distended on the day of discharge.  She had no abdominal tenderness.  She is to follow a soft diet which she is now tolerating for the remainder of the week and advance her diet slowly over the next 10 days.  I will see her in the office in 1 week after discharge.  We should consider a colonoscopy in the near future some 6 weeks after discharge.     Luanna Cole. Lenord Fellers, M.D.     MJB/MEDQ  D:  02/22/2011  T:  02/22/2011  Job:  [id number]  Electronically Signed by Marlan Palau M.D. on 04/01/2011  08:55:47 AM

## 2011-04-22 ENCOUNTER — Other Ambulatory Visit: Payer: Self-pay | Admitting: Gastroenterology

## 2011-05-25 ENCOUNTER — Encounter: Payer: Self-pay | Admitting: Internal Medicine

## 2011-08-02 ENCOUNTER — Other Ambulatory Visit: Payer: Self-pay | Admitting: Internal Medicine

## 2011-08-02 DIAGNOSIS — Z1231 Encounter for screening mammogram for malignant neoplasm of breast: Secondary | ICD-10-CM

## 2011-09-06 ENCOUNTER — Ambulatory Visit
Admission: RE | Admit: 2011-09-06 | Discharge: 2011-09-06 | Disposition: A | Discharge status: Home / Self Care | Payer: BC Managed Care – PPO | Source: Ambulatory Visit | Attending: Internal Medicine | Admitting: Internal Medicine

## 2011-09-06 DIAGNOSIS — Z1231 Encounter for screening mammogram for malignant neoplasm of breast: Secondary | ICD-10-CM

## 2011-10-19 ENCOUNTER — Encounter: Payer: Self-pay | Admitting: Internal Medicine

## 2011-10-19 ENCOUNTER — Ambulatory Visit (INDEPENDENT_AMBULATORY_CARE_PROVIDER_SITE_OTHER): Payer: BC Managed Care – PPO | Admitting: Internal Medicine

## 2011-10-19 VITALS — BP 120/68 | HR 76 | Temp 99.7°F | Wt 174.0 lb

## 2011-10-19 DIAGNOSIS — J101 Influenza due to other identified influenza virus with other respiratory manifestations: Secondary | ICD-10-CM

## 2011-10-19 DIAGNOSIS — F419 Anxiety disorder, unspecified: Secondary | ICD-10-CM

## 2011-10-19 DIAGNOSIS — J111 Influenza due to unidentified influenza virus with other respiratory manifestations: Secondary | ICD-10-CM

## 2011-10-19 DIAGNOSIS — F411 Generalized anxiety disorder: Secondary | ICD-10-CM

## 2012-01-04 ENCOUNTER — Encounter: Payer: Self-pay | Admitting: Internal Medicine

## 2012-01-04 NOTE — Patient Instructions (Signed)
Take Tamiflu twice daily for 5 days. Take Zithromax Z-PAK. Hydrocodone cough syrup as needed for cough

## 2012-01-04 NOTE — Progress Notes (Signed)
  Subjective:    Patient ID: Natalie [name], female    DOB: 1946-03-15, 66 y.o.   MRN: [id number]  HPI Patient has developed acute onset malaise, fatigue, myalgias, cough and congestion. Feels awful. Slight sore throat. No diarrhea. Slight nausea no vomiting.    Review of Systems     Objective:   Physical Exam Pharynx slightly injected; TMs slightly full not red; Neck: supple without adenopathy. Chest clear.        Assessment & Plan:  Influenza  Tamiflu 75 mg by mouth twice daily for 5 days.  Zithromax Z-Pak take 2 tablets by mouth day one followed by 1 tablet by mouth days 2 through 5.

## 2012-01-09 ENCOUNTER — Encounter: Payer: Self-pay | Admitting: Internal Medicine

## 2012-02-29 ENCOUNTER — Other Ambulatory Visit: Payer: Self-pay | Admitting: Internal Medicine

## 2012-02-29 ENCOUNTER — Other Ambulatory Visit: Payer: BC Managed Care – PPO | Admitting: Internal Medicine

## 2012-02-29 DIAGNOSIS — Z0000 Encounter for general adult medical examination without abnormal findings: Secondary | ICD-10-CM

## 2012-02-29 LAB — COMPREHENSIVE METABOLIC PANEL
AST: 22 U/L (ref 0–37)
Alkaline Phosphatase: 85 U/L (ref 39–117)
Glucose, Bld: 151 mg/dL — ABNORMAL HIGH (ref 70–99)
Sodium: 141 mEq/L (ref 135–145)
Total Bilirubin: 0.4 mg/dL (ref 0.3–1.2)
Total Protein: 7.1 g/dL (ref 6.0–8.3)

## 2012-02-29 LAB — CBC WITH DIFFERENTIAL/PLATELET
Basophils Absolute: 0.1 10*3/uL (ref 0.0–0.1)
Basophils Relative: 1 % (ref 0–1)
Eosinophils Absolute: 0.5 10*3/uL (ref 0.0–0.7)
MCH: 29 pg (ref 26.0–34.0)
MCHC: 32.4 g/dL (ref 30.0–36.0)
Monocytes Relative: 10 % (ref 3–12)
Neutro Abs: 3.7 10*3/uL (ref 1.7–7.7)
Neutrophils Relative %: 51 % (ref 43–77)
Platelets: 231 10*3/uL (ref 150–400)
RDW: 13.8 % (ref 11.5–15.5)

## 2012-02-29 LAB — LIPID PANEL
HDL: 40 mg/dL (ref >39)
LDL Cholesterol: 98 mg/dL (ref 0–99)
Total CHOL/HDL Ratio: 4.6 Ratio
Triglycerides: 221 mg/dL — ABNORMAL HIGH (ref <150)

## 2012-03-01 LAB — VITAMIN D 25 HYDROXY (VIT D DEFICIENCY, FRACTURES): Vit D, 25-Hydroxy: 31 ng/mL (ref 30–89)

## 2012-03-02 ENCOUNTER — Encounter: Payer: Self-pay | Admitting: Internal Medicine

## 2012-03-02 ENCOUNTER — Ambulatory Visit (INDEPENDENT_AMBULATORY_CARE_PROVIDER_SITE_OTHER): Payer: BC Managed Care – PPO | Admitting: Internal Medicine

## 2012-03-02 VITALS — BP 106/74 | HR 88 | Temp 99.3°F | Ht 65.0 in | Wt 182.0 lb

## 2012-03-02 DIAGNOSIS — Z0000 Encounter for general adult medical examination without abnormal findings: Secondary | ICD-10-CM

## 2012-03-02 DIAGNOSIS — R3129 Other microscopic hematuria: Secondary | ICD-10-CM

## 2012-03-02 DIAGNOSIS — Z1272 Encounter for screening for malignant neoplasm of vagina: Secondary | ICD-10-CM

## 2012-03-02 DIAGNOSIS — E781 Pure hyperglyceridemia: Secondary | ICD-10-CM

## 2012-03-02 LAB — POCT URINALYSIS DIPSTICK
Ketones, UA: NEGATIVE
Leukocytes, UA: NEGATIVE
Nitrite, UA: NEGATIVE
Protein, UA: NEGATIVE
pH, UA: 5.5

## 2012-03-02 LAB — HEMOGLOBIN A1C
Hgb A1c MFr Bld: 7 % — ABNORMAL HIGH (ref <5.7)
Mean Plasma Glucose: 154 mg/dL — ABNORMAL HIGH (ref <117)

## 2012-03-03 ENCOUNTER — Other Ambulatory Visit (HOSPITAL_COMMUNITY)
Admission: RE | Admit: 2012-03-03 | Discharge: 2012-03-03 | Disposition: A | Discharge status: Home / Self Care | Payer: BC Managed Care – PPO | Source: Ambulatory Visit | Attending: Internal Medicine | Admitting: Internal Medicine

## 2012-03-03 DIAGNOSIS — Z01419 Encounter for gynecological examination (general) (routine) without abnormal findings: Secondary | ICD-10-CM | POA: Insufficient documentation

## 2012-03-03 LAB — URINALYSIS, ROUTINE W REFLEX MICROSCOPIC
Ketones, ur: NEGATIVE mg/dL
Nitrite: NEGATIVE
Specific Gravity, Urine: 1.026 (ref 1.005–1.030)
Urobilinogen, UA: 0.2 mg/dL (ref 0.0–1.0)
pH: 5 (ref 5.0–8.0)

## 2012-03-03 LAB — URINALYSIS, MICROSCOPIC ONLY

## 2012-03-04 LAB — URINE CULTURE

## 2012-03-16 ENCOUNTER — Ambulatory Visit (INDEPENDENT_AMBULATORY_CARE_PROVIDER_SITE_OTHER): Payer: BC Managed Care – PPO | Admitting: Internal Medicine

## 2012-03-16 DIAGNOSIS — Z23 Encounter for immunization: Secondary | ICD-10-CM

## 2012-03-19 ENCOUNTER — Encounter: Payer: Self-pay | Admitting: Internal Medicine

## 2012-03-19 DIAGNOSIS — E781 Pure hyperglyceridemia: Secondary | ICD-10-CM | POA: Insufficient documentation

## 2012-03-19 NOTE — Patient Instructions (Signed)
Return one year or as needed.

## 2012-03-19 NOTE — Progress Notes (Addendum)
Subjective:    Patient ID: Natalie [name], female    DOB: [birth year], 66 y.o.   MRN: [id number]  HPI 66 year old white female for health maintenance exam and evaluation of medical problems. Patient has history of anxiety with flying in airplanes. History of moderate hyperlipidemia. History of fractured foot secondary to a fall in 1986. Right shoulder adhesive capsulitis November 2010. Had vaginal hysterectomy with enterocele and rectocele repair 2004. Influenza earlier this year. Had colonoscopy by Dr. Ninetta Lights may 2012 showing hyperplastic polyps. Patient was hospitalized in the spring of 2012 after a bout of gastroenteritis resulting in a partial small bowel obstruction. Was treated with IV fluids and bowel rest and partial small bowel obstruction resolved. No history of abnormal Pap smears. Last Pap smear November 2010. Dr. Nicholas Lose was GYN physician.  Patient has no known drug allergies. Takes calcium and vitamin D. Takes Valium as needed for anxiety. Says she does better with Valium and Xanax.  Patient is married. Husband operates [employer]. They live in [place]. Social alcohol consumption. Does not smoke. Has college education. Hasn't worked outside the home since 1986. Has an adult daughter and adult son in good health.  Family history: Father died at 27 due to diffuse alveolar damage of the lung  Mother died at age 11 of complications of pneumonia. One sister in good health.    Review of Systems  Constitutional: Negative.   HENT: Negative.   Eyes: Negative.   Respiratory: Negative.   Cardiovascular: Negative.   Gastrointestinal:       History of partial small bowel obstruction after bout of gastroenteritis spring 2012  Genitourinary: Negative.        Microscopic hematuria  Musculoskeletal: Negative.   Neurological: Negative.   Hematological: Negative.   Psychiatric/Behavioral:       History of anxiety       Objective:   Physical Exam  Vitals  reviewed. Constitutional: She is oriented to person, place, and time. She appears well-developed and well-nourished. No distress.  HENT:  Head: Normocephalic and atraumatic.  Right Ear: External ear normal.  Left Ear: External ear normal.  Mouth/Throat: Oropharynx is clear and moist.  Eyes: Conjunctivae and EOM are normal. Pupils are equal, round, and reactive to light. Right eye exhibits no discharge. Left eye exhibits no discharge. No scleral icterus.  Neck: Neck supple. No JVD present. No thyromegaly present.  Cardiovascular: Normal rate, regular rhythm, normal heart sounds and intact distal pulses.   No murmur heard. Pulmonary/Chest: Effort normal and breath sounds normal. No respiratory distress. She has no wheezes. She has no rales.  Abdominal: Soft. Bowel sounds are normal. She exhibits no distension and no mass. There is no tenderness. There is no rebound and no guarding.  Genitourinary: Vagina normal. Guaiac negative stool. No vaginal discharge found.  Musculoskeletal: She exhibits no edema and no tenderness.  Lymphadenopathy:    She has no cervical adenopathy.  Neurological: She is alert and oriented to person, place, and time. She has normal reflexes. She displays normal reflexes. No cranial nerve deficit. Coordination normal.  Skin: Skin is warm and dry. No rash noted. She is not diaphoretic.  Psychiatric: She has a normal mood and affect. Her behavior is normal. Judgment and thought content normal.          Assessment & Plan:  Normal health maintenance exam  History of anxiety  History of partial small bowel obstruction 2012 after bout of gastroenteritis  Moderately elevated triglycerides  History of right leg dermatitis  treated with Vanos cream  Mild hyperglycemia  Plan: Trial of diet and exercise. Return in August 2013 for hemoglobin A1c and fasting lipid panel

## 2012-06-02 ENCOUNTER — Other Ambulatory Visit: Payer: BC Managed Care – PPO | Admitting: Internal Medicine

## 2012-06-02 DIAGNOSIS — R7301 Impaired fasting glucose: Secondary | ICD-10-CM

## 2012-06-02 LAB — HEMOGLOBIN A1C
Hgb A1c MFr Bld: 6.5 % — ABNORMAL HIGH (ref <5.7)
Mean Plasma Glucose: 140 mg/dL — ABNORMAL HIGH (ref <117)

## 2012-06-05 ENCOUNTER — Other Ambulatory Visit: Payer: BC Managed Care – PPO | Admitting: Internal Medicine

## 2012-06-06 ENCOUNTER — Ambulatory Visit (INDEPENDENT_AMBULATORY_CARE_PROVIDER_SITE_OTHER): Payer: BC Managed Care – PPO | Admitting: Internal Medicine

## 2012-06-06 ENCOUNTER — Encounter: Payer: Self-pay | Admitting: Internal Medicine

## 2012-06-06 VITALS — BP 104/68 | HR 80 | Temp 98.7°F | Ht 65.0 in | Wt 179.0 lb

## 2012-06-06 DIAGNOSIS — E781 Pure hyperglyceridemia: Secondary | ICD-10-CM

## 2012-06-06 DIAGNOSIS — R7309 Other abnormal glucose: Secondary | ICD-10-CM

## 2012-06-06 DIAGNOSIS — R7302 Impaired glucose tolerance (oral): Secondary | ICD-10-CM

## 2012-06-29 ENCOUNTER — Encounter: Payer: Self-pay | Admitting: Internal Medicine

## 2012-06-29 NOTE — Patient Instructions (Addendum)
Continue diet and exercise. We encourage him to lose more weight. Continue to watch  diet in terms of low-fat low-carb

## 2012-06-29 NOTE — Progress Notes (Signed)
  Subjective:    Patient ID: Natalie [name], female    DOB: 07/12/1946, 66 y.o.   MRN: [id number]  HPI At time of her physical exam in May, she had elevated hemoglobin A1c of 7%. After trial of diet and exercise this is now improved to 6.5% and she is pleased. She has lost 3 pounds since May. She is watching her diet more closely. History of hypertriglyceridemia likely related to impaired glucose tolerance.    Review of Systems     Objective:   Physical Exam neck is supple without JVD thyromegaly or carotid bruits. Chest clear to auscultation. Cardiac exam regular rate and rhythm. Extremities without edema. Diabetic foot exam no evidence of diabetic foot ulcers and pulses are normal.        Assessment & Plan:  Impaired glucose tolerance  History of hypertriglyceridemia-likely related to impaired glucose tolerance  Plan: Patient will continue with diet and exercise and we will reevaluate in 4-6 months with hemoglobin A1c and lipid panel as well as office visit.

## 2012-08-09 ENCOUNTER — Other Ambulatory Visit: Payer: Self-pay | Admitting: Internal Medicine

## 2012-08-09 DIAGNOSIS — Z1231 Encounter for screening mammogram for malignant neoplasm of breast: Secondary | ICD-10-CM

## 2012-09-06 ENCOUNTER — Ambulatory Visit
Admission: RE | Admit: 2012-09-06 | Discharge: 2012-09-06 | Disposition: A | Discharge status: Home / Self Care | Payer: BC Managed Care – PPO | Source: Ambulatory Visit | Attending: Internal Medicine | Admitting: Internal Medicine

## 2012-09-06 DIAGNOSIS — Z1231 Encounter for screening mammogram for malignant neoplasm of breast: Secondary | ICD-10-CM

## 2012-10-05 ENCOUNTER — Encounter: Payer: Self-pay | Admitting: Internal Medicine

## 2012-10-05 ENCOUNTER — Ambulatory Visit (INDEPENDENT_AMBULATORY_CARE_PROVIDER_SITE_OTHER): Payer: BC Managed Care – PPO | Admitting: Internal Medicine

## 2012-10-05 VITALS — BP 114/76 | HR 100 | Temp 99.8°F | Wt 182.0 lb

## 2012-10-05 DIAGNOSIS — J029 Acute pharyngitis, unspecified: Secondary | ICD-10-CM

## 2012-10-05 LAB — POCT RAPID STREP A (OFFICE): Rapid Strep A Screen: NEGATIVE

## 2012-10-05 NOTE — Patient Instructions (Addendum)
Take Zithromax Z-PAK as directed. Take Tessalon Perles as needed for cough. Call if not better in 10 days.

## 2012-10-05 NOTE — Progress Notes (Signed)
  Subjective:    Patient ID: Natalie [name], female    DOB: 1946-06-07, 66 y.o.   MRN: [id number]  HPI 66 year old white female in today with URI symptoms for several days. Over the Thanksgiving holiday she was around her daughter and grandchild who were both sick.  Patient started out with sore throat for several days and now has respiratory congestion and cough. Not sleeping well at night. No shaking chills. Took Sudafed but it caused some jitteriness. Has been taking Claritin daily and that seems to help her congestion. Has malaise and fatigue.    Review of Systems     Objective:   Physical Exam HEENT exam: TMs are full bilaterally but not red. Pharynx is red without exudate. Rapid strep screen is negative. Neck is supple without significant adenopathy. Chest clear to auscultation. She sounds nasally congested.            Assessment & Plan:  Bilateral acute serous otitis media  Pharyngitis  Plan: Zithromax Z-Pak take 2 tablets day one followed by 1 tablet days 2 through 5. Tessalon Perles 100 mg (#60) 2 by mouth 3 times a day when necessary cough.

## 2013-01-15 ENCOUNTER — Ambulatory Visit: Payer: BC Managed Care – PPO | Admitting: Internal Medicine

## 2013-01-15 ENCOUNTER — Encounter: Payer: Self-pay | Admitting: Internal Medicine

## 2013-01-15 VITALS — BP 112/80 | HR 80 | Temp 99.0°F | Wt 180.0 lb

## 2013-01-15 DIAGNOSIS — H6590 Unspecified nonsuppurative otitis media, unspecified ear: Secondary | ICD-10-CM

## 2013-01-15 DIAGNOSIS — J029 Acute pharyngitis, unspecified: Secondary | ICD-10-CM

## 2013-01-15 NOTE — Progress Notes (Signed)
  Subjective:    Patient ID: Natalie [name], female    DOB: 08-Oct-1946, 67 y.o.   MRN: [id number]  HPI Had right otalgia and sore throat onset Wednesday, March 12. Says she could not come to the doctor because she had tickets to the [facility] basketball tournament. The previous weekend she was out of power for 2 days. No fever or shaking chills. No significant cough.    Review of Systems     Objective:   Physical Exam HEENT exam: Pharynx is red without exudate. Both TMs are full but not red. Neck is supple without significant adenopathy. Chest clear to auscultation.        Assessment & Plan:  Bilateral serous otitis media  Pharyngitis  Plan: Patient refuses to take generic medication. Doesn't mind paying for brand name medication. Prescribed Avelox 400 mg #7 one by mouth daily with a meal.

## 2013-01-15 NOTE — Patient Instructions (Addendum)
Take Avelox 400 mg daily for 7 days. Call if not better in 7 days

## 2013-01-22 ENCOUNTER — Telehealth: Payer: Self-pay

## 2013-01-22 MED ORDER — PROMETHAZINE HCL 25 MG PO TABS: 25.0000 mg | ORAL_TABLET | Freq: Three times a day (TID) | ORAL | Status: DC | PRN

## 2013-01-22 NOTE — Telephone Encounter (Signed)
Requesting appointment for today. Developed nausea and vomiting Saturday am, which is now resolved except she now has diarrhea. Took her last Avelox yesterday. Doesn't understand how she could have gotten sick while on antibitics

## 2013-01-22 NOTE — Telephone Encounter (Signed)
Patient informed. Rx sent to [facility] [address]

## 2013-01-22 NOTE — Telephone Encounter (Signed)
Could be antibiotic related or a virus. Can call in Phenergan tablets if needed for nausea. Clear liquids until diarrhea resolves.

## 2013-03-08 ENCOUNTER — Other Ambulatory Visit: Payer: BC Managed Care – PPO | Admitting: Internal Medicine

## 2013-03-08 DIAGNOSIS — Z0000 Encounter for general adult medical examination without abnormal findings: Secondary | ICD-10-CM

## 2013-03-08 LAB — LIPID PANEL
HDL: 38 mg/dL — ABNORMAL LOW (ref >39)
LDL Cholesterol: 98 mg/dL (ref 0–99)

## 2013-03-08 LAB — COMPREHENSIVE METABOLIC PANEL
ALT: 25 U/L (ref 0–35)
AST: 26 U/L (ref 0–37)
Albumin: 4.1 g/dL (ref 3.5–5.2)
Alkaline Phosphatase: 74 U/L (ref 39–117)
BUN: 14 mg/dL (ref 6–23)
Potassium: 4.3 mEq/L (ref 3.5–5.3)
Sodium: 140 mEq/L (ref 135–145)
Total Protein: 7 g/dL (ref 6.0–8.3)

## 2013-03-08 LAB — CBC WITH DIFFERENTIAL/PLATELET
Basophils Absolute: 0.1 10*3/uL (ref 0.0–0.1)
Basophils Relative: 1 % (ref 0–1)
MCHC: 33.7 g/dL (ref 30.0–36.0)
Neutro Abs: 3.7 10*3/uL (ref 1.7–7.7)
Neutrophils Relative %: 53 % (ref 43–77)
RDW: 14.6 % (ref 11.5–15.5)
WBC: 7.1 10*3/uL (ref 4.0–10.5)

## 2013-03-08 LAB — TSH: TSH: 2.609 u[IU]/mL (ref 0.350–4.500)

## 2013-03-09 ENCOUNTER — Encounter: Payer: Self-pay | Admitting: Internal Medicine

## 2013-03-09 ENCOUNTER — Ambulatory Visit (INDEPENDENT_AMBULATORY_CARE_PROVIDER_SITE_OTHER): Payer: BC Managed Care – PPO | Admitting: Internal Medicine

## 2013-03-09 VITALS — BP 98/74 | HR 80 | Temp 99.0°F | Ht 65.0 in | Wt 174.5 lb

## 2013-03-09 DIAGNOSIS — R319 Hematuria, unspecified: Secondary | ICD-10-CM

## 2013-03-09 DIAGNOSIS — E781 Pure hyperglyceridemia: Secondary | ICD-10-CM

## 2013-03-09 DIAGNOSIS — R7302 Impaired glucose tolerance (oral): Secondary | ICD-10-CM | POA: Insufficient documentation

## 2013-03-09 DIAGNOSIS — Z0000 Encounter for general adult medical examination without abnormal findings: Secondary | ICD-10-CM

## 2013-03-09 DIAGNOSIS — F411 Generalized anxiety disorder: Secondary | ICD-10-CM

## 2013-03-09 LAB — POCT URINALYSIS DIPSTICK
Spec Grav, UA: >=1.03
Urobilinogen, UA: NEGATIVE

## 2013-03-09 NOTE — Progress Notes (Signed)
  Subjective:    Patient ID: Natalie [name], female    DOB: 09-Apr-1946, 67 y.o.   MRN: [id number]  HPI  67 year old White female for health maintenance and evaluation of medical problems.       Review of Systems     Objective:   Physical Exam        Assessment & Plan:

## 2013-03-09 NOTE — Patient Instructions (Addendum)
Continue diet exercise and return in 6 months 

## 2013-03-09 NOTE — Progress Notes (Signed)
  Subjective:    Patient ID: Natalie [name], female    DOB: December 02, 1945, 67 y.o.   MRN: [id number]  HPI 67 year old white female for health maintenance and evaluation of medical problems including impaired glucose tolerance, hypertriglyceridemia, obesity. History of anxiety. Hemoglobin A1c is pending. Fasting glucose is 131. Triglycerides have improved somewhat from 6 months ago just with diet and exercise. She doesn't exercise as regularly as she should.  Has a history of anxiety with flying airplanes. History of moderate hyperlipidemia. History of fractured foot secondary to a fall in 1986. Right shoulder adhesive capsulitis November 2010. Had vaginal hysterectomy with enterocele and rectocele repair 2004. Had colonoscopy May 2012 showing hyperplastic polyps. Patient was hospitalized in the spring of 2012 after a bout of gastroenteritis resulting in a partial small bowel obstruction. Was treated with IV fluids bowel rest and partial small bowel obstruction resolved. No history of abnormal Pap smears. Last Pap smear was November 2010. Dr. Nicholas Lose was her GYN physician.  No known drug allergies. Takes calcium and vitamin D. Takes Valium as needed for anxiety. Says she does better with Valium than with Xanax.  Social history: Patient is married. Husband operates [employer]. They reside in [place].  She does not smoke. Social alcohol consumption. Has college education. Has not worked outside the home since 1986. 2 adult children- a son and a daughter in good health Family history: Father died at 35 due to diffuse alveolar damage of the lung. Mother died at age 69 of complications of pneumonia. One sister in good health.    Review of Systems  Constitutional: Negative.   All other systems reviewed and are negative.       Objective:   Physical Exam  Vitals reviewed. Constitutional: She is oriented to person, place, and time. She appears well-developed and well-nourished. No distress.   HENT:  Head: Normocephalic and atraumatic.  Right Ear: External ear normal.  Left Ear: External ear normal.  Mouth/Throat: Oropharynx is clear and moist. No oropharyngeal exudate.  Eyes: Conjunctivae and EOM are normal. Pupils are equal, round, and reactive to light. Right eye exhibits no discharge. Left eye exhibits no discharge. No scleral icterus.  Neck: Neck supple. No JVD present. No thyromegaly present.  Cardiovascular: Normal rate, regular rhythm, normal heart sounds and intact distal pulses.   No murmur heard. Pulmonary/Chest: Effort normal and breath sounds normal. She has no wheezes. She has no rales.  Breasts normal female  Abdominal: Bowel sounds are normal. She exhibits no distension and no mass. There is no tenderness. There is no rebound and no guarding.  Genitourinary:  Status post hysterectomy. Bimanual exam is normal.  Musculoskeletal: She exhibits no edema.  Lymphadenopathy:    She has no cervical adenopathy.  Neurological: She is alert and oriented to person, place, and time. She has normal reflexes. No cranial nerve deficit. Coordination normal.  Skin: Skin is warm and dry. No rash noted. She is not diaphoretic.  Psychiatric: She has a normal mood and affect. Her behavior is normal. Judgment and thought content normal.          Assessment & Plan:  Anxiety-stable  Hypertriglyceridemia-recommend continue diet exercise  Impaired with those tolerance-continue diet and exercise. Hemoglobin A1c pending.  Obesity-diet and exercise  Plan: Return in 6 months for six-month recheck. Will need lipid panel and hemoglobin A1c at that time.

## 2013-03-10 LAB — URINALYSIS, MICROSCOPIC ONLY
Bacteria, UA: NONE SEEN
Casts: NONE SEEN
Crystals: NONE SEEN

## 2013-03-10 LAB — URINALYSIS, ROUTINE W REFLEX MICROSCOPIC
Bilirubin Urine: NEGATIVE
Hgb urine dipstick: NEGATIVE
Ketones, ur: NEGATIVE mg/dL
Specific Gravity, Urine: 1.03 (ref 1.005–1.030)
Urobilinogen, UA: 0.2 mg/dL (ref 0.0–1.0)
pH: 5 (ref 5.0–8.0)

## 2013-08-06 ENCOUNTER — Other Ambulatory Visit: Payer: Self-pay

## 2013-08-06 DIAGNOSIS — Z1231 Encounter for screening mammogram for malignant neoplasm of breast: Secondary | ICD-10-CM

## 2013-09-07 ENCOUNTER — Ambulatory Visit
Admission: RE | Admit: 2013-09-07 | Discharge: 2013-09-07 | Disposition: A | Discharge status: Home / Self Care | Payer: Medicare Other | Source: Ambulatory Visit

## 2013-09-07 DIAGNOSIS — Z1231 Encounter for screening mammogram for malignant neoplasm of breast: Secondary | ICD-10-CM

## 2013-09-11 ENCOUNTER — Other Ambulatory Visit: Payer: Medicare Other | Admitting: Internal Medicine

## 2013-09-11 DIAGNOSIS — E781 Pure hyperglyceridemia: Secondary | ICD-10-CM

## 2013-09-14 ENCOUNTER — Encounter: Payer: Self-pay | Admitting: Internal Medicine

## 2013-09-14 ENCOUNTER — Ambulatory Visit (INDEPENDENT_AMBULATORY_CARE_PROVIDER_SITE_OTHER): Payer: Medicare Other | Admitting: Internal Medicine

## 2013-09-14 VITALS — BP 120/78 | HR 80 | Temp 98.5°F | Ht 65.0 in | Wt 177.0 lb

## 2013-09-14 DIAGNOSIS — R079 Chest pain, unspecified: Secondary | ICD-10-CM

## 2013-09-14 DIAGNOSIS — IMO0002 Reserved for concepts with insufficient information to code with codable children: Secondary | ICD-10-CM

## 2013-09-14 DIAGNOSIS — H05232 Hemorrhage of left orbit: Secondary | ICD-10-CM

## 2013-09-14 DIAGNOSIS — H05239 Hemorrhage of unspecified orbit: Secondary | ICD-10-CM

## 2013-09-14 DIAGNOSIS — E781 Pure hyperglyceridemia: Secondary | ICD-10-CM

## 2013-09-14 DIAGNOSIS — S60512A Abrasion of left hand, initial encounter: Secondary | ICD-10-CM

## 2013-09-14 DIAGNOSIS — R0781 Pleurodynia: Secondary | ICD-10-CM

## 2013-09-15 NOTE — Patient Instructions (Signed)
Tylenol as needed for pain. Have CT of the brain and orbits without contrast on Monday, November 17. Have x-ray of left hand at the same time and left rib detail films. Watch diet and repeat triglycerides at time of physical exam May 2015

## 2013-09-15 NOTE — Progress Notes (Signed)
  Subjective:    Patient ID: Natalie [name], female    DOB: [birth year], 67 y.o.   MRN: [id number]  HPI Patient is here today to followup on hypertriglyceridemia. However, she was in [place] last weekend, stepped off of a curb and tripped over some granite bricks. She had no loss of consciousness. She broke her glasses. She has a left periorbital hematoma. She has a slight headache. Neck is not sore. She has some pain under her left rib cage area. She injured her left hand. She has abrasion on the lateral aspect of left hand and some swelling along the fifth metacarpal.   With regard to triglycerides, she says she ate a lot in [place] and triglycerides have increased from 189-259. Has not been watching her diet. Has been to a lot of football games this fall with tailgating parties.    Review of Systems     Objective:   Physical Exam She is alert and oriented x3. PERRLA. Funduscopic exam is benign. She has superficial abrasion left hand  lateral aspect along fifth metacarpal. No evidence of secondary infection. She has a left periorbital hematoma. She has tenderness under her left anterior lateral rib cage area. Chest is clear to auscultation. Brief neurological exam shows no focal deficits.       Assessment & Plan:  Periorbital hematoma-rule out orbital fracture or subdural hematoma-to have CT without contrast of the brain/maxillofacial Monday, November 17  Trauma left hand with superficial abrasion rule out fracture-to be x-rayed Monday, November 17  Left anterior rib cage pain-rule out fractured rib-left rib detail films ordered November 7  Hypertriglyceridemia-triglycerides have increased from 6 months ago. Needs to watch diet and exercise. Repeat in 6 months at time of physical exam  Patient declines offer for pain medication. May take Tylenol as needed for pain.  Time spent with patient 30 minutes

## 2013-09-17 ENCOUNTER — Ambulatory Visit
Admission: RE | Admit: 2013-09-17 | Discharge: 2013-09-17 | Disposition: A | Discharge status: Home / Self Care | Payer: Medicare Other | Source: Ambulatory Visit | Attending: Internal Medicine | Admitting: Internal Medicine

## 2013-09-17 DIAGNOSIS — H05232 Hemorrhage of left orbit: Secondary | ICD-10-CM

## 2013-09-17 DIAGNOSIS — S60512A Abrasion of left hand, initial encounter: Secondary | ICD-10-CM

## 2013-09-17 DIAGNOSIS — R0781 Pleurodynia: Secondary | ICD-10-CM

## 2014-03-11 ENCOUNTER — Other Ambulatory Visit: Payer: Medicare Other | Admitting: Internal Medicine

## 2014-03-12 ENCOUNTER — Encounter: Payer: Medicare Other | Admitting: Internal Medicine

## 2014-04-01 ENCOUNTER — Other Ambulatory Visit: Payer: Medicare Other | Admitting: Internal Medicine

## 2014-04-01 DIAGNOSIS — Z0000 Encounter for general adult medical examination without abnormal findings: Secondary | ICD-10-CM

## 2014-04-01 DIAGNOSIS — Z1329 Encounter for screening for other suspected endocrine disorder: Secondary | ICD-10-CM

## 2014-04-01 DIAGNOSIS — E781 Pure hyperglyceridemia: Secondary | ICD-10-CM

## 2014-04-01 DIAGNOSIS — Z130 Encounter for screening for diseases of the blood and blood-forming organs and certain disorders involving the immune mechanism: Secondary | ICD-10-CM

## 2014-04-01 DIAGNOSIS — R7301 Impaired fasting glucose: Secondary | ICD-10-CM

## 2014-04-01 DIAGNOSIS — Z13228 Encounter for screening for other metabolic disorders: Secondary | ICD-10-CM

## 2014-04-01 LAB — COMPREHENSIVE METABOLIC PANEL
ALBUMIN: 4.3 g/dL (ref 3.5–5.2)
ALT: 29 U/L (ref 0–35)
AST: 35 U/L (ref 0–37)
Alkaline Phosphatase: 75 U/L (ref 39–117)
BUN: 12 mg/dL (ref 6–23)
CO2: 26 meq/L (ref 19–32)
Calcium: 9.3 mg/dL (ref 8.4–10.5)
Chloride: 106 mEq/L (ref 96–112)
Creat: 0.71 mg/dL (ref 0.50–1.10)
GLUCOSE: 152 mg/dL — AB (ref 70–99)
POTASSIUM: 4.3 meq/L (ref 3.5–5.3)
Sodium: 139 mEq/L (ref 135–145)
Total Bilirubin: 0.6 mg/dL (ref 0.2–1.2)
Total Protein: 7.1 g/dL (ref 6.0–8.3)

## 2014-04-01 LAB — CBC WITH DIFFERENTIAL/PLATELET
Basophils Absolute: 0.1 10*3/uL (ref 0.0–0.1)
Basophils Relative: 1 % (ref 0–1)
Eosinophils Absolute: 0.3 10*3/uL (ref 0.0–0.7)
Eosinophils Relative: 5 % (ref 0–5)
HEMATOCRIT: 40.4 % (ref 36.0–46.0)
HEMOGLOBIN: 13.9 g/dL (ref 12.0–15.0)
LYMPHS ABS: 1.8 10*3/uL (ref 0.7–4.0)
LYMPHS PCT: 32 % (ref 12–46)
MCH: 29.4 pg (ref 26.0–34.0)
MCHC: 34.4 g/dL (ref 30.0–36.0)
MCV: 85.4 fL (ref 78.0–100.0)
MONO ABS: 0.5 10*3/uL (ref 0.1–1.0)
MONOS PCT: 9 % (ref 3–12)
NEUTROS ABS: 3 10*3/uL (ref 1.7–7.7)
Neutrophils Relative %: 53 % (ref 43–77)
Platelets: 211 10*3/uL (ref 150–400)
RBC: 4.73 MIL/uL (ref 3.87–5.11)
RDW: 14.1 % (ref 11.5–15.5)
WBC: 5.7 10*3/uL (ref 4.0–10.5)

## 2014-04-01 LAB — LIPID PANEL
Cholesterol: 178 mg/dL (ref 0–200)
HDL: 35 mg/dL — ABNORMAL LOW (ref >39)
LDL Cholesterol: 100 mg/dL — ABNORMAL HIGH (ref 0–99)
TRIGLYCERIDES: 217 mg/dL — AB (ref <150)
Total CHOL/HDL Ratio: 5.1 Ratio
VLDL: 43 mg/dL — ABNORMAL HIGH (ref 0–40)

## 2014-04-01 LAB — HEMOGLOBIN A1C
Hgb A1c MFr Bld: 8.1 % — ABNORMAL HIGH (ref <5.7)
Mean Plasma Glucose: 186 mg/dL — ABNORMAL HIGH (ref <117)

## 2014-04-02 ENCOUNTER — Ambulatory Visit (INDEPENDENT_AMBULATORY_CARE_PROVIDER_SITE_OTHER): Payer: Medicare Other | Admitting: Internal Medicine

## 2014-04-02 ENCOUNTER — Encounter: Payer: Self-pay | Admitting: Internal Medicine

## 2014-04-02 VITALS — BP 122/76 | HR 84 | Ht 65.25 in | Wt 176.0 lb

## 2014-04-02 DIAGNOSIS — E781 Pure hyperglyceridemia: Secondary | ICD-10-CM

## 2014-04-02 DIAGNOSIS — F411 Generalized anxiety disorder: Secondary | ICD-10-CM

## 2014-04-02 DIAGNOSIS — Z0000 Encounter for general adult medical examination without abnormal findings: Secondary | ICD-10-CM

## 2014-04-02 DIAGNOSIS — E119 Type 2 diabetes mellitus without complications: Secondary | ICD-10-CM

## 2014-04-02 LAB — POCT URINALYSIS DIPSTICK
Bilirubin, UA: NEGATIVE
Blood, UA: NEGATIVE
Glucose, UA: NEGATIVE
Leukocytes, UA: NEGATIVE
NITRITE UA: NEGATIVE
PH UA: 6
Protein, UA: NEGATIVE
Spec Grav, UA: >=1.03
Urobilinogen, UA: NEGATIVE

## 2014-04-02 LAB — TSH: TSH: 1.905 u[IU]/mL (ref 0.350–4.500)

## 2014-04-02 LAB — VITAMIN D 25 HYDROXY (VIT D DEFICIENCY, FRACTURES): Vit D, 25-Hydroxy: 47 ng/mL (ref 30–89)

## 2014-04-08 NOTE — Patient Instructions (Signed)
Patient is reluctant to start medication for diabetes. She is also reluctant to watch her Accu-Cheks saying she cannot possibly do fingersticks. She wants to try diet first. We have agreed that she will return in 6 weeks after strict diet and exercise attempt.

## 2014-04-08 NOTE — Progress Notes (Signed)
Subjective:    Patient ID: Natalie [name], female    DOB: [birth year], 68 y.o.   MRN: [id number]  HPI 68 year old White female in today for Welcome to Medicare physical exam. History of impaired glucose tolerance, anxiety and hypertriglyceridemia. She's been traveling recently and hasn't been watching her diet.  Past medical history: Anxiety is particularly bothersome with airplane flight. History of fractured foot secondary to a fall in 1986. History of right shoulder adhesive capsulitis November 2010. History of vaginal hysterectomy with enterocele and rectocele repair 2004. Had colonoscopy May 2012 showing hyperplastic polyps. Was hospitalized in the spring of 2012 after a bout of gastroenteritis resulting in a partial small bowel obstruction. Was treated with IV fluids, bowel rest and obstruction resolved. No history of abnormal Pap smears. Last Pap smear was done 2013. Takes Valium as needed for anxiety. Says she does better with bag and then with Xanax. She takes calcium and vitamin D.  No known drug allergies.  Social history: Patient is married. Husband operates [employer]. They reside in [place]. She does not smoke. Social alcohol consumption. Has a college education. Has not worked outside of the home since 1986. 2 adult children.  Family history: Father died at age 67 due to diffuse alveolar damage of the lung. Mother died at age 65 of complications of pneumonia. One sister in good health.    Review of Systems  Constitutional: Negative.   All other systems reviewed and are negative.      Objective:   Physical Exam  Vitals reviewed. Constitutional: She is oriented to person, place, and time. She appears well-developed and well-nourished. No distress.  HENT:  Head: Normocephalic and atraumatic.  Right Ear: External ear normal.  Left Ear: External ear normal.  Mouth/Throat: Oropharynx is clear and moist. No oropharyngeal exudate.  Eyes: Conjunctivae and EOM are  normal. Pupils are equal, round, and reactive to light. Right eye exhibits no discharge. Left eye exhibits no discharge. No scleral icterus.  Neck: Neck supple. No JVD present. No thyromegaly present.  Cardiovascular: Normal rate, regular rhythm and normal heart sounds.   No murmur heard. Pulmonary/Chest: Effort normal and breath sounds normal. No respiratory distress. She has no wheezes. She has no rales. She exhibits no tenderness.  Breasts normal female  Abdominal: Soft. Bowel sounds are normal. She exhibits no distension and no mass. There is no tenderness. There is no rebound and no guarding.  Genitourinary:  Last Pap 2013. Bimanual normal.  Musculoskeletal: Normal range of motion. She exhibits no edema.  Neurological: She is alert and oriented to person, place, and time. She has normal reflexes. She displays normal reflexes. No cranial nerve deficit. Coordination normal.  Skin: Skin is warm and dry. No rash noted. She is not diaphoretic.  Psychiatric: She has a normal mood and affect. Her behavior is normal. Judgment and thought content normal.          Assessment & Plan:  New onset type 2 diabetes mellitus-previously had impaired glucose tolerance. Patient does not want to test Accu-Cheks saying she is afraid to do fingersticks. She will try diet and exercise and followup in 6 weeks. Needs to be educated in diabetic diet. Attempted to do this today. She may need further assistance.  Hypertriglyceridemia-diabetes likely contributing to this. Doesn't want to start statin medication.  Anxiety  Plan: Strict diet and exercise and return in 6 weeks for office visit hemoglobin A1c. We failed to do welcome to Medicare EKG today and will do that  at next visit.  Subjective:   Patient presents for Medicare Annual/Subsequent preventive examination.  Review Past Medical/Family/Social: See above   Risk Factors : Cardiac disease-diabetes and hypertriglyceridemia Current exercise habits:  Sedentary Dietary issues discussed -diabetic diet reviewed  Depression Screen  (Note: if answer to either of the following is "Yes", a more complete depression screening is indicated)   Over the past two weeks, have you felt down, depressed or hopeless? No  Over the past two weeks, have you felt little interest or pleasure in doing things? No Have you lost interest or pleasure in daily life? No Do you often feel hopeless? No Do you cry easily over simple problems? No   Activities of Daily Living  In your present state of health, do you have any difficulty performing the following activities?:   Driving? No  Managing money? No  Feeding yourself? No  Getting from bed to chair? No  Climbing a flight of stairs? No  Preparing food and eating?: No  Bathing or showering? No  Getting dressed: No  Getting to the toilet? No  Using the toilet:No  Moving around from place to place: No  In the past year have you fallen or had a near fall?:No  Are you sexually active? No  Do you have more than one partner? No   Hearing Difficulties: No  Do you often ask people to speak up or repeat themselves? No  Do you experience ringing or noises in your ears? No  Do you have difficulty understanding soft or whispered voices? No  Do you feel that you have a problem with memory? No Do you often misplace items? No    Home Safety:  Do you have a smoke alarm at your residence? Yes Do you have grab bars in the bathroom? No Do you have throw rugs in your house? Yes   Cognitive Testing  Alert? Yes Normal Appearance?Yes  Oriented to person? Yes Place? Yes  Time? Yes  Recall of three objects? Yes  Can perform simple calculations? Yes  Displays appropriate judgment?Yes  Can read the correct time from a watch face?Yes   List the Names of Other Physician/Practitioners you currently use:  See referral list for the physicians patient is currently seeing.  Eye physician   Review of Systems: See  above   Objective:     General appearance: Appears stated age and mildly obese  Head: Normocephalic, without obvious abnormality, atraumatic  Eyes: conj clear, EOMi PEERLA  Ears: normal TM's and external ear canals both ears  Nose: Nares normal. Septum midline. Mucosa normal. No drainage or sinus tenderness.  Throat: lips, mucosa, and tongue normal; teeth and gums normal  Neck: no adenopathy, no carotid bruit, no JVD, supple, symmetrical, trachea midline and thyroid not enlarged, symmetric, no tenderness/mass/nodules  No CVA tenderness.  Lungs: clear to auscultation bilaterally  Breasts: normal appearance, no masses or tenderness Heart: regular rate and rhythm, S1, S2 normal, no murmur, click, rub or gallop  Abdomen: soft, non-tender; bowel sounds normal; no masses, no organomegaly  Musculoskeletal: ROM normal in all joints, no crepitus, no deformity, Normal muscle strengthen. Back  is symmetric, no curvature. Skin: Skin color, texture, turgor normal. No rashes or lesions  Lymph nodes: Cervical, supraclavicular, and axillary nodes normal.  Neurologic: CN 2 -12 Normal, Normal symmetric reflexes. Normal coordination and gait  Psych: Alert & Oriented x 3, Mood appear stable.    Assessment:    Annual wellness medicare exam   Plan:  During the course of the visit the patient was educated and counseled about appropriate screening and preventive services including:   Annual mammogram     Patient Instructions (the written plan) was given to the patient.  Medicare Attestation  I have personally reviewed:  The patient's medical and social history  Their use of alcohol, tobacco or illicit drugs  Their current medications and supplements  The patient's functional ability including ADLs,fall risks, home safety risks, cognitive, and hearing and visual impairment  Diet and physical activities  Evidence for depression or mood disorders  The patient's weight, height, BMI, and visual  acuity have been recorded in the chart. I have made referrals, counseling, and provided education to the patient based on review of the above and I have provided the patient with a written personalized care plan for preventive services.

## 2014-05-21 ENCOUNTER — Other Ambulatory Visit: Payer: Medicare Other | Admitting: Internal Medicine

## 2014-05-21 DIAGNOSIS — E119 Type 2 diabetes mellitus without complications: Secondary | ICD-10-CM

## 2014-05-22 LAB — HEMOGLOBIN A1C
Hgb A1c MFr Bld: 7.4 % — ABNORMAL HIGH (ref <5.7)
Mean Plasma Glucose: 166 mg/dL — ABNORMAL HIGH (ref <117)

## 2014-05-23 ENCOUNTER — Ambulatory Visit (INDEPENDENT_AMBULATORY_CARE_PROVIDER_SITE_OTHER): Payer: Medicare Other | Admitting: Internal Medicine

## 2014-05-23 ENCOUNTER — Encounter: Payer: Self-pay | Admitting: Internal Medicine

## 2014-05-23 VITALS — BP 118/84 | HR 76 | Wt 175.0 lb

## 2014-05-23 DIAGNOSIS — Z0000 Encounter for general adult medical examination without abnormal findings: Secondary | ICD-10-CM

## 2014-05-23 NOTE — Progress Notes (Signed)
   Subjective:    Patient ID: Natalie [name], female    DOB: [birth year], 68 y.o.   MRN: [id number]  HPI  Welcome to Medicare EKG done today. It was forgotten at original visit 04/08/2014. Patient has been working on her diet some but also has been traveling a fair amount. Hemoglobin A1c has come down from 8.1-7.4. She refuses to purchase Accu-Chek machine. Is afraid to use lancets. Says she believes she can even do better with diet and exercise is given a bit more time. Traveling again in September. We have agreed she will come in in late November before Thanksgiving and have a hemoglobin A1c drawn at that time.    Review of Systems     Objective:   Physical Exam not examined. Spoke with patient about diet exercise and weight loss. Limit alcohol consumption.        Assessment & Plan:  New onset diabetes with improvement in hemoglobin A1c  Plan: Repeat hemoglobin A1c November 2015 and followup then. Hold off on medication for now.  15 minutes spent with patient speaking with her about diet exercise and weight loss

## 2014-05-23 NOTE — Patient Instructions (Signed)
Continue to work on diet exercise and weight loss. Return November 2015 for hemoglobin A1c approximate third week in November.

## 2014-08-01 ENCOUNTER — Other Ambulatory Visit: Payer: Self-pay

## 2014-08-01 DIAGNOSIS — Z1239 Encounter for other screening for malignant neoplasm of breast: Secondary | ICD-10-CM

## 2014-09-09 ENCOUNTER — Ambulatory Visit
Admission: RE | Admit: 2014-09-09 | Discharge: 2014-09-09 | Disposition: A | Discharge status: Home / Self Care | Payer: Medicare Other | Source: Ambulatory Visit

## 2014-09-09 DIAGNOSIS — Z1239 Encounter for other screening for malignant neoplasm of breast: Secondary | ICD-10-CM

## 2014-09-24 ENCOUNTER — Other Ambulatory Visit: Payer: Medicare Other | Admitting: Internal Medicine

## 2014-09-24 DIAGNOSIS — E119 Type 2 diabetes mellitus without complications: Secondary | ICD-10-CM

## 2014-09-24 LAB — HEMOGLOBIN A1C
HEMOGLOBIN A1C: 7.1 % — AB (ref <5.7)
Mean Plasma Glucose: 157 mg/dL — ABNORMAL HIGH (ref <117)

## 2014-09-25 ENCOUNTER — Telehealth: Payer: Self-pay

## 2014-09-25 NOTE — Telephone Encounter (Signed)
Patient informed of A1C results.  She will call to schedule follow up.

## 2014-09-25 NOTE — Telephone Encounter (Signed)
-----   Message from Elby Showers, MD sent at 09/25/2014 12:09 PM EST ----- Please call pt. AIC is better at 7.1%.  Recheck with OV in 3 months. Happy Thanksgiving.

## 2014-10-07 ENCOUNTER — Telehealth: Payer: Self-pay

## 2014-10-07 NOTE — Telephone Encounter (Signed)
Spoke with patient regarding flu vaccine.  Per patient she had her flu vaccine at [facility].

## 2015-05-08 ENCOUNTER — Encounter: Payer: Self-pay | Admitting: Internal Medicine

## 2015-05-08 ENCOUNTER — Ambulatory Visit (INDEPENDENT_AMBULATORY_CARE_PROVIDER_SITE_OTHER): Payer: Medicare Other | Admitting: Internal Medicine

## 2015-05-08 VITALS — BP 110/74 | HR 78 | Temp 97.8°F | Wt 163.0 lb

## 2015-05-08 DIAGNOSIS — R350 Frequency of micturition: Secondary | ICD-10-CM | POA: Diagnosis not present

## 2015-05-08 DIAGNOSIS — R7309 Other abnormal glucose: Secondary | ICD-10-CM | POA: Diagnosis not present

## 2015-05-08 DIAGNOSIS — R739 Hyperglycemia, unspecified: Secondary | ICD-10-CM

## 2015-05-08 LAB — CBC WITH DIFFERENTIAL/PLATELET
Basophils Absolute: 0.1 10*3/uL (ref 0.0–0.1)
Basophils Relative: 1 % (ref 0–1)
Eosinophils Absolute: 0.1 10*3/uL (ref 0.0–0.7)
Eosinophils Relative: 2 % (ref 0–5)
HCT: 45.2 % (ref 36.0–46.0)
Hemoglobin: 15.4 g/dL — ABNORMAL HIGH (ref 12.0–15.0)
LYMPHS ABS: 1.7 10*3/uL (ref 0.7–4.0)
LYMPHS PCT: 27 % (ref 12–46)
MCH: 30.5 pg (ref 26.0–34.0)
MCHC: 34.1 g/dL (ref 30.0–36.0)
MCV: 89.5 fL (ref 78.0–100.0)
MPV: 10.7 fL (ref 8.6–12.4)
Monocytes Absolute: 0.4 10*3/uL (ref 0.1–1.0)
Monocytes Relative: 7 % (ref 3–12)
Neutro Abs: 4 10*3/uL (ref 1.7–7.7)
Neutrophils Relative %: 63 % (ref 43–77)
Platelets: 193 10*3/uL (ref 150–400)
RBC: 5.05 MIL/uL (ref 3.87–5.11)
RDW: 13.4 % (ref 11.5–15.5)
WBC: 6.4 10*3/uL (ref 4.0–10.5)

## 2015-05-08 LAB — POCT URINALYSIS DIPSTICK
Bilirubin, UA: NEGATIVE
Blood, UA: NEGATIVE
LEUKOCYTES UA: NEGATIVE
Nitrite, UA: NEGATIVE
Protein, UA: NEGATIVE
Spec Grav, UA: 1.01
UROBILINOGEN UA: NEGATIVE
pH, UA: 6

## 2015-05-08 LAB — BASIC METABOLIC PANEL
BUN: 14 mg/dL (ref 6–23)
CALCIUM: 9.6 mg/dL (ref 8.4–10.5)
CO2: 22 meq/L (ref 19–32)
CREATININE: 0.76 mg/dL (ref 0.50–1.10)
Chloride: 96 mEq/L (ref 96–112)
Glucose, Bld: 588 mg/dL (ref 70–99)
Potassium: 4.5 mEq/L (ref 3.5–5.3)
Sodium: 131 mEq/L — ABNORMAL LOW (ref 135–145)

## 2015-05-08 LAB — GLUCOSE, POCT (MANUAL RESULT ENTRY): POC Glucose: HIGH mg/dl (ref 70–99)

## 2015-05-08 LAB — HEMOGLOBIN A1C
HEMOGLOBIN A1C: 13.4 % — AB (ref <5.7)
MEAN PLASMA GLUCOSE: 338 mg/dL — AB (ref <117)

## 2015-05-09 ENCOUNTER — Ambulatory Visit (INDEPENDENT_AMBULATORY_CARE_PROVIDER_SITE_OTHER): Payer: Medicare Other | Admitting: Internal Medicine

## 2015-05-09 ENCOUNTER — Encounter: Payer: Self-pay | Admitting: Internal Medicine

## 2015-05-09 VITALS — BP 112/78 | HR 68 | Temp 98.4°F | Wt 163.0 lb

## 2015-05-09 DIAGNOSIS — R350 Frequency of micturition: Secondary | ICD-10-CM

## 2015-05-09 LAB — POCT URINALYSIS DIPSTICK
Bilirubin, UA: NEGATIVE
LEUKOCYTES UA: NEGATIVE
NITRITE UA: NEGATIVE
PH UA: 6
PROTEIN UA: NEGATIVE
RBC UA: NEGATIVE
Spec Grav, UA: 1.02
Urobilinogen, UA: NEGATIVE

## 2015-05-09 NOTE — Progress Notes (Signed)
   Subjective:    Patient ID: Natalie [name], female    DOB: September 27, 1946, 69 y.o.   MRN: [id number]  HPI New-onset diabetic with serum glucose yesterday 588. Hemoglobin A1c 13.4%. Started on insulin last night with sliding scale. She could not bear to prick herself so husband did it for her. At 11:00 last night Accu-Chek was 478, she covered herself with sliding scale, at 2:30 AM it was 294, at 7:15 this morning it was 242.  CBC showed white blood cell count of 6400, hemoglobin 15.4 g, platelet count 193,000.  Yesterday potassium was 4.5 and sodium 131. Much less urinary frequency last night after starting insulin. Still has ketones in urine today. Serum acetone unfortunately has three-day turnaround. She feels well and looks great. White count is normal. No evidence of underlying infection.  No nausea vomiting diarrhea, headache, chills  Review of Systems see above     Objective:   Physical Exam  Not examined. Spent 20 minutes with patient and husband today.      Assessment & Plan:  New onset diabetes-improved today with regular insulin  Plan: She will start Januvia 100 mg daily and continue sliding scale insulin as follows  Accu-Cheks greater than 2 5012 units of insulin  Accu-Cheks 300-3 5010 units of insulin  Accu-Chek 250-307 units of insulin  Accu-Chek 200-253 units of insulin  Accu-Chek less than 200 no insulin  They know they may call me over the weekend if they have any questions or issues. Otherwise return on Tuesday.

## 2015-05-09 NOTE — Patient Instructions (Signed)
Start Januvia 100 mg daily. Continue sliding scale insulin as directed. Return on Tuesday next week. Call if you have any concerns

## 2015-05-10 LAB — ACETONE

## 2015-05-13 ENCOUNTER — Ambulatory Visit (INDEPENDENT_AMBULATORY_CARE_PROVIDER_SITE_OTHER): Payer: Medicare Other | Admitting: Internal Medicine

## 2015-05-13 ENCOUNTER — Encounter: Payer: Self-pay | Admitting: Internal Medicine

## 2015-05-13 VITALS — BP 104/72 | HR 70 | Temp 98.4°F | Wt 163.0 lb

## 2015-05-13 DIAGNOSIS — E119 Type 2 diabetes mellitus without complications: Secondary | ICD-10-CM

## 2015-05-13 LAB — GLUCOSE, POCT (MANUAL RESULT ENTRY): POC Glucose: 250 mg/dl — AB (ref 70–99)

## 2015-05-13 NOTE — Patient Instructions (Signed)
Decrease Accu-Cheks to before breakfast and before supper. Continue sliding scale. Refer to diabetes treatment Center. Continue Januvia.

## 2015-05-13 NOTE — Progress Notes (Signed)
   Subjective:    Patient ID: Natalie [name], female    DOB: [birth year], 69 y.o.   MRN: [id number]  HPI In today to follow-up on new onset diabetes mellitus.  On July 9, Accu-Cheks were as follows: 8:30 AM-279  12:40 PM-256  4:45 PM-195  10 PM-309  On July 10 Accu-Cheks were as follows: 8:20 AM to 47  12:30 PM-252  6 PM-249  10:30 PM-234  On July 11 Accu-Cheks were as follows  9 AM to 14  1:15 PM to 24  6:30 PM 202  10:45 PM 196  Accu-Chek this morning at 7:25 AM was 185 and is now 250.  Husband is out of town this week. She's had a friend who used to be a nurse doing her Accu-Cheks. She simply can't learn to prick her self at this point. She is also anxious and crying in the office today. I think she's done extremely well.  She is now on Januvia 100 mg daily. We may need to add metformin to that.  Serum acetone was negative.  Diabetic eye exam due in January  Review of Systems     Objective:   Physical Exam  Skin is warm and dry. Chest clear. Cardiac exam regular rate and rhythm.      Assessment & Plan:  Spent 25 minutes with patient reviewing her diet. Not been exercising. May return to regular activities. Stay well hydrated.  Continue Januvia 100 mg daily. Return Friday, July 15. May decrease Accu-Cheks to before breakfast and supper. Continue sliding scale. Refer to diabetes treatment Center.

## 2015-05-16 ENCOUNTER — Ambulatory Visit (INDEPENDENT_AMBULATORY_CARE_PROVIDER_SITE_OTHER): Payer: Medicare Other | Admitting: Internal Medicine

## 2015-05-16 ENCOUNTER — Encounter: Payer: Self-pay | Admitting: Internal Medicine

## 2015-05-16 VITALS — BP 118/70 | HR 87 | Temp 98.8°F

## 2015-05-16 DIAGNOSIS — E119 Type 2 diabetes mellitus without complications: Secondary | ICD-10-CM

## 2015-05-16 DIAGNOSIS — R350 Frequency of micturition: Secondary | ICD-10-CM

## 2015-05-16 LAB — POCT URINALYSIS DIPSTICK
Bilirubin, UA: NEGATIVE
Blood, UA: NEGATIVE
LEUKOCYTES UA: NEGATIVE
NITRITE UA: NEGATIVE
PH UA: 5
Protein, UA: NEGATIVE
Urobilinogen, UA: NEGATIVE

## 2015-05-16 NOTE — Patient Instructions (Addendum)
Continue Accu-Cheks twice daily before breakfast and supper. Use sliding scale if necessary. Continue Januvia.

## 2015-05-16 NOTE — Progress Notes (Signed)
   Subjective:    Patient ID: Natalie [name], female    DOB: 29-Jul-1946, 69 y.o.   MRN: [id number]  HPI Has appointment week after next be seen at [facility] for education and nutritional counseling. She had been scheduled for physical exam July 28. We are going to defer that along with fasting labs for 4 weeks giving  her opportunity to get diabetes under control. She brings in multiple Accu-Chek readings over the past couple of days. Remains on sliding scale insulin and Januvia. Yesterday morning Accu-Chek was 204 and at supper was 139. This morning it was 208. On July 13 Accu-Chek was 2 10 in the morning and 213 in the evening. On July 11, Accu-Chek was 214 in the morning and 202 before supper. She's making considerable progress. She's learning what a. Not exercising much. These to travel out of town to see someone that is ill in [place] later this weekend. She will continue Accu-Cheks before breakfast and before supper. She will maintain sliding scale insulin if needed.  Reminded about diabetic eye exam  Review of Systems     Objective:   Physical Exam  Not examined. Spent 20 minutes speaking with patient about glucose management and concerns      Assessment & Plan:  New-onset type 2 diabetes mellitus  Plan: Return in 4 weeks for follow-up. Call  if she has questions or concerns.  Complaint of urinary frequency in the night. Urinalysis shows no evidence of infection. Still has ketones in urine but serum acetone was negative.

## 2015-05-20 ENCOUNTER — Telehealth: Payer: Self-pay | Admitting: Internal Medicine

## 2015-05-20 NOTE — Telephone Encounter (Signed)
Patient newly diagnosed with TYPE 2 DM.  Today is her wedding anniversary.  Wants to know when they go to dinner tonight if she is ok to have a glass of champagne or a vodka tonic??

## 2015-05-20 NOTE — Telephone Encounter (Signed)
Left message informing patient she can have one beverage to celebrate her anniversary.

## 2015-05-20 NOTE — Telephone Encounter (Signed)
One drink only

## 2015-05-22 ENCOUNTER — Other Ambulatory Visit: Payer: Self-pay | Admitting: Internal Medicine

## 2015-05-27 ENCOUNTER — Encounter: Payer: Medicare Other | Attending: Internal Medicine

## 2015-05-27 VITALS — Ht 65.0 in | Wt 164.1 lb

## 2015-05-27 DIAGNOSIS — Z6827 Body mass index (BMI) 27.0-27.9, adult: Secondary | ICD-10-CM | POA: Diagnosis not present

## 2015-05-27 DIAGNOSIS — E119 Type 2 diabetes mellitus without complications: Secondary | ICD-10-CM | POA: Diagnosis present

## 2015-05-27 DIAGNOSIS — Z713 Dietary counseling and surveillance: Secondary | ICD-10-CM | POA: Insufficient documentation

## 2015-05-29 ENCOUNTER — Encounter: Payer: Self-pay | Admitting: Internal Medicine

## 2015-05-29 NOTE — Progress Notes (Signed)
Patient was seen on 05/26/15 for the first of a series of three diabetes self-management courses at [facility].  Patient Education Plan per assessed needs and concerns is to attend four course education program for Diabetes Self Management Education.  The following learning objectives were met by the patient during this class:  Describe diabetes  State some common risk factors for diabetes  Defines the role of glucose and insulin  Identifies type of diabetes and pathophysiology  Describe the relationship between diabetes and cardiovascular risk  State the members of the Healthcare Team  States the rationale for glucose monitoring  State when to test glucose  State their individual Target Range  State the importance of logging glucose readings  Describe how to interpret glucose readings  Identifies A1C target  Explain the correlation between A1c and eAG values  State symptoms and treatment of high blood glucose  State symptoms and treatment of low blood glucose  Explain proper technique for glucose testing  Identifies proper sharps disposal  Handouts given during class include:  Living Well with Diabetes book  Carb Counting and Meal Planning book  Meal Plan Card  Carbohydrate guide  Meal planning worksheet  Low Sodium Flavoring Tips  The diabetes portion plate  J1O to eAG Conversion Chart  Diabetes Medications  Diabetes Recommended Care Schedule  Support Group  Diabetes Success Plan  Core Class Satisfaction Survey  Follow-Up Plan:  Attend core 2

## 2015-05-31 ENCOUNTER — Encounter: Payer: Self-pay | Admitting: Internal Medicine

## 2015-05-31 NOTE — Patient Instructions (Addendum)
She is being placed on a sliding scale regular insulin regimen and will return tomorrow. Husband will help her check her Accu-Cheks on a regular basis before meals and at bedtime. Patient does not want to be hospitalized. She is to cancel upcoming trips out of state. Start Januvia 100 mg daily.

## 2015-05-31 NOTE — Progress Notes (Signed)
   Subjective:    Patient ID: Natalie [name], female    DOB: June 07, 1946, 69 y.o.   MRN: [id number]  HPI Patient in today complaining of urinary frequency for several weeks. Urinalysis showed significant glucosuria and ketones. She was diagnosed last year with diabetes mellitus but has not been seen in follow-up. November 2015 hemoglobin A1c was 7.1%. Patient admitted she did not want to check her glucose at that time and was upset about being diagnosed with diabetes. Wanted to control it with diet alone.  No recent infection that she is aware of. Just having a great deal of polyuria.  Serum glucose is 588. Sodium is 131. Serum acetone is negative.    Review of Systems     Objective:   Physical Exam  Skin warm and dry. Nodes none. Neck supple without adenopathy JVD or thyromegaly. Chest clear. Cardiac exam regular rate and rhythm normal S1 and S2. Extremities without edema      Assessment & Plan:  Polyuria due to diabetes poorly controlled  Hyperglycemia  Poorly controlled diabetes mellitus  Plan: Start Januvia 100 mg daily. Sliding scale insulin with regular insulin before meals and at bedtime. Husband will help her with Accu-Chek machine. Return tomorrow for follow-up. Patient does not want to be hospitalized. She is to cancel upcoming trips out of state until we get glucose under control

## 2015-06-03 ENCOUNTER — Encounter: Payer: Medicare Other | Attending: Internal Medicine

## 2015-06-03 DIAGNOSIS — E119 Type 2 diabetes mellitus without complications: Secondary | ICD-10-CM | POA: Diagnosis present

## 2015-06-03 DIAGNOSIS — Z6827 Body mass index (BMI) 27.0-27.9, adult: Secondary | ICD-10-CM | POA: Insufficient documentation

## 2015-06-03 DIAGNOSIS — Z713 Dietary counseling and surveillance: Secondary | ICD-10-CM | POA: Insufficient documentation

## 2015-06-03 NOTE — Progress Notes (Signed)

## 2015-06-10 DIAGNOSIS — E119 Type 2 diabetes mellitus without complications: Secondary | ICD-10-CM

## 2015-06-11 NOTE — Progress Notes (Signed)
Patient was seen on 06/10/15 for the third of a series of three diabetes self-management courses at the [facility].   [place] the amount of activity recommended for healthy living . Describe activities suitable for individual needs . Identify ways to regularly incorporate activity into daily life . Identify barriers to activity and ways to over come these barriers  Identify diabetes medications being personally used and their primary action for lowering glucose and possible side effects . Describe role of stress on blood glucose and develop strategies to address psychosocial issues . Identify diabetes complications and ways to prevent them  Explain how to manage diabetes during illness . Evaluate success in meeting personal goal . Establish 2-3 goals that they will plan to diligently work on until they return for the  66-month follow-up visit  Goals:   I will count my carb choices at most meals and snacks  I will be active 30 minutes or more 3 times a week  I will take my diabetes medications as scheduled  I will eat less unhealthy fats by eating less desserts  I will test my glucose at least 2 times a day, 7 days a week  I will look at patterns in my record book at least each days a month  To help manage stress I will  Try to walk at least 3 times a week  Your patient has identified these potential barriers to change:  Motivation  Your patient has identified their diabetes self-care support plan as  Family Support Plan:  Attend Optional Core 4 in 4 months

## 2015-06-23 ENCOUNTER — Other Ambulatory Visit: Payer: Medicare Other | Admitting: Internal Medicine

## 2015-06-23 DIAGNOSIS — Z79899 Other long term (current) drug therapy: Secondary | ICD-10-CM

## 2015-06-23 DIAGNOSIS — E781 Pure hyperglyceridemia: Secondary | ICD-10-CM

## 2015-06-23 DIAGNOSIS — E119 Type 2 diabetes mellitus without complications: Secondary | ICD-10-CM

## 2015-06-23 DIAGNOSIS — R5383 Other fatigue: Secondary | ICD-10-CM

## 2015-06-23 DIAGNOSIS — E559 Vitamin D deficiency, unspecified: Secondary | ICD-10-CM

## 2015-06-23 LAB — COMPLETE METABOLIC PANEL WITH GFR
ALT: 14 U/L (ref 6–29)
AST: 19 U/L (ref 10–35)
Albumin: 4.1 g/dL (ref 3.6–5.1)
Alkaline Phosphatase: 57 U/L (ref 33–130)
BUN: 16 mg/dL (ref 7–25)
CALCIUM: 9.2 mg/dL (ref 8.6–10.4)
CO2: 24 mmol/L (ref 20–31)
CREATININE: 0.73 mg/dL (ref 0.50–0.99)
Chloride: 109 mmol/L (ref 98–110)
GFR, Est African American: >89 mL/min (ref >=60)
GFR, Est Non African American: 85 mL/min (ref >=60)
Glucose, Bld: 98 mg/dL (ref 65–99)
Potassium: 4.2 mmol/L (ref 3.5–5.3)
Sodium: 146 mmol/L (ref 135–146)
Total Bilirubin: 0.5 mg/dL (ref 0.2–1.2)
Total Protein: 7.1 g/dL (ref 6.1–8.1)

## 2015-06-23 LAB — CBC WITH DIFFERENTIAL/PLATELET
BASOS ABS: 0.1 10*3/uL (ref 0.0–0.1)
Basophils Relative: 1 % (ref 0–1)
EOS PCT: 6 % — AB (ref 0–5)
Eosinophils Absolute: 0.4 10*3/uL (ref 0.0–0.7)
HEMATOCRIT: 42.5 % (ref 36.0–46.0)
HEMOGLOBIN: 14.1 g/dL (ref 12.0–15.0)
Lymphocytes Relative: 30 % (ref 12–46)
Lymphs Abs: 1.9 10*3/uL (ref 0.7–4.0)
MCH: 29.4 pg (ref 26.0–34.0)
MCHC: 33.2 g/dL (ref 30.0–36.0)
MCV: 88.7 fL (ref 78.0–100.0)
MPV: 10.8 fL (ref 8.6–12.4)
Monocytes Absolute: 0.5 10*3/uL (ref 0.1–1.0)
Monocytes Relative: 8 % (ref 3–12)
NEUTROS ABS: 3.4 10*3/uL (ref 1.7–7.7)
Neutrophils Relative %: 55 % (ref 43–77)
Platelets: 225 10*3/uL (ref 150–400)
RBC: 4.79 MIL/uL (ref 3.87–5.11)
RDW: 13.9 % (ref 11.5–15.5)
WBC: 6.2 10*3/uL (ref 4.0–10.5)

## 2015-06-23 LAB — HEMOGLOBIN A1C
Hgb A1c MFr Bld: 8.9 % — ABNORMAL HIGH (ref <5.7)
Mean Plasma Glucose: 209 mg/dL — ABNORMAL HIGH (ref <117)

## 2015-06-23 LAB — TSH: TSH: 3.158 u[IU]/mL (ref 0.350–4.500)

## 2015-06-23 LAB — LIPID PANEL
CHOL/HDL RATIO: 3.9 ratio (ref <=5.0)
CHOLESTEROL: 158 mg/dL (ref 125–200)
HDL: 41 mg/dL — AB (ref >=46)
LDL Cholesterol: 92 mg/dL (ref <130)
Triglycerides: 125 mg/dL (ref <150)
VLDL: 25 mg/dL (ref <30)

## 2015-06-24 LAB — VITAMIN D 25 HYDROXY (VIT D DEFICIENCY, FRACTURES): Vit D, 25-Hydroxy: 37 ng/mL (ref 30–100)

## 2015-06-26 ENCOUNTER — Encounter: Payer: Self-pay | Admitting: Internal Medicine

## 2015-06-26 ENCOUNTER — Ambulatory Visit (INDEPENDENT_AMBULATORY_CARE_PROVIDER_SITE_OTHER): Payer: Medicare Other | Admitting: Internal Medicine

## 2015-06-26 VITALS — BP 116/68 | HR 87 | Temp 97.9°F | Ht 65.0 in | Wt 159.5 lb

## 2015-06-26 DIAGNOSIS — E119 Type 2 diabetes mellitus without complications: Secondary | ICD-10-CM

## 2015-06-26 DIAGNOSIS — Z23 Encounter for immunization: Secondary | ICD-10-CM | POA: Diagnosis not present

## 2015-06-26 DIAGNOSIS — Z0000 Encounter for general adult medical examination without abnormal findings: Secondary | ICD-10-CM

## 2015-06-26 DIAGNOSIS — F411 Generalized anxiety disorder: Secondary | ICD-10-CM

## 2015-06-26 LAB — POCT URINALYSIS DIPSTICK
Bilirubin, UA: NEGATIVE
Blood, UA: NEGATIVE
GLUCOSE UA: NEGATIVE
Ketones, UA: NEGATIVE
Leukocytes, UA: NEGATIVE
Nitrite, UA: NEGATIVE
Protein, UA: NEGATIVE
Urobilinogen, UA: NEGATIVE
pH, UA: 5

## 2015-06-27 LAB — MICROALBUMIN / CREATININE URINE RATIO
Creatinine, Urine: 231.4 mg/dL
MICROALB UR: 1.7 mg/dL (ref <2.0)
Microalb Creat Ratio: 7.3 mg/g (ref 0.0–30.0)

## 2015-07-01 ENCOUNTER — Encounter: Payer: Self-pay | Admitting: Internal Medicine

## 2015-07-01 NOTE — Progress Notes (Signed)
Subjective:    Patient ID: Natalie [name], female    DOB: 20-Aug-1946, 69 y.o.   MRN: [id number]  HPI  Pleasant 69 year old White Female diagnosed in July  with  new-onset diabetes mellitus after presenting with urinary frequency. Hemoglobin A1c was 13.4%. Fortunately she and her husband were motivated and we were able to treat her as an outpatient. Hemoglobin A1c had been 7.1% in November 2015 but she had not been willing to accept the diagnosis. She's done well since July having been placed on Januvia. She went to the [facility] which she found to be helpful She checks her Accu-Cheks twice daily religiously and is following a strict diabetic diet. Hemoglobin A1c is now 8.9%. I am pleased with her progress.  She has a history of anxiety and hypertriglyceridemia.  Past medical history: Anxieties particular bothersome with airplane flight. History of fractured foot secondary to fall in 1986. History of right shoulder adhesive capsulitis November 2010. History of vaginal hysterectomy with enterocele and rectocele repair 2004. Had colonoscopy May 2012 showing hyperplastic polyps. Was hospitalized in the spring of 2012 after a bout of gastroenteritis resulting in a partial small bowel obstruction. Was treated with IV fluids, bowel rest and subsequently the obstruction resolved. No history of abnormal Pap smears. Takes Valium as needed for anxiety. Says she does better with that than with Xanax. She takes calcium and vitamin D.  No known drug allergies.   Social history: She is married. Husband operates [employer]. They reside in [place]. She does not smoke. Social alcohol consumption. 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 Has not worked outside of the home since 1986. 2 adult children.  Family history: Father died at age 36 due to diffuse alveolar damage of the lung. Mother died at age 67 of complications of pneumonia. One sister in good health.    Review of Systems    Constitutional: Negative.   All other systems reviewed and are negative.       Objective:   Physical Exam  Constitutional: She is oriented to person, place, and time. She appears well-developed and well-nourished. No distress.  HENT:  Head: Normocephalic and atraumatic.  Right Ear: External ear normal.  Left Ear: External ear normal.  Mouth/Throat: Oropharynx is clear and moist. No oropharyngeal exudate.  Eyes: Conjunctivae are normal. Pupils are equal, round, and reactive to light. Right eye exhibits no discharge. Left eye exhibits no discharge. No scleral icterus.  Wears glasses  Neck: Neck supple. No JVD present. No thyromegaly present.  Cardiovascular: Normal rate, regular rhythm, normal heart sounds and intact distal pulses.   No murmur heard. Pulmonary/Chest: Effort normal and breath sounds normal. She has no wheezes. She has no rales.  Breasts normal female without masses  Abdominal: Soft. Bowel sounds are normal. She exhibits no distension and no mass. There is no tenderness. There is no rebound and no guarding.  Genitourinary:  Bimanual normal. Pap deferred. Last Pap was 2013.  Musculoskeletal: She exhibits no edema.  Neurological: She is alert and oriented to person, place, and time. She has normal reflexes. No cranial nerve deficit. Coordination normal.  Skin: Skin is warm and dry. No rash noted. She is not diaphoretic.  Psychiatric: She has a normal mood and affect. Her behavior is normal. Judgment and thought content normal.  Vitals reviewed.         Assessment & Plan:  Improved type 2 diabetes mellitus with oral agent  History of anxiety  Hypertriglyceridemia aggravated by diabetes  Plan: Return in November for follow-up. Continue diet and exercise and checking Accu-Cheks twice daily.  Considerations include putting her on low-dose statin, aspirin 81 mg daily, low-dose ARB   Subjective:   Patient presents for Medicare Annual/Subsequent preventive  examination.  Review Past Medical/Family/Social:   Risk Factors  Current exercise habits:  Dietary issues discussed:   Cardiac risk factors:  Depression Screen  (Note: if answer to either of the following is "Yes", a more complete depression screening is indicated)   Over the past two weeks, have you felt down, depressed or hopeless? No  Over the past two weeks, have you felt little interest or pleasure in doing things? No Have you lost interest or pleasure in daily life? No Do you often feel hopeless? No Do you cry easily over simple problems? No   Activities of Daily Living  In your present state of health, do you have any difficulty performing the following activities?:   Driving? No  Managing money? No  Feeding yourself? No  Getting from bed to chair? No  Climbing a flight of stairs? No  Preparing food and eating?: No  Bathing or showering? No  Getting dressed: No  Getting to the toilet? No  Using the toilet:No  Moving around from place to place: No  In the past year have you fallen or had a near fall?:No  Are you sexually active? No  Do you have more than one partner? No   Hearing Difficulties: No  Do you often ask people to speak up or repeat themselves? No  Do you experience ringing or noises in your ears? No  Do you have difficulty understanding soft or whispered voices? No  Do you feel that you have a problem with memory? No Do you often misplace items? No    Home Safety:  Do you have a smoke alarm at your residence? Yes Do you have grab bars in the bathroom? No Do you have throw rugs in your house? Yes   Cognitive Testing  Alert? Yes Normal Appearance?Yes  Oriented to person? Yes Place? Yes  Time? Yes  Recall of three objects? Yes  Can perform simple calculations? Yes  Displays appropriate judgment?Yes  Can read the correct time from a watch face?Yes   List the Names of Other Physician/Practitioners you currently use:  See referral list for the  physicians patient is currently seeing.     Review of Systems: As above   Objective:     General appearance: Appears stated age and mildly obese  Head: Normocephalic, without obvious abnormality, atraumatic  Eyes: conj clear, EOMi PEERLA  Ears: normal TM's and external ear canals both ears  Nose: Nares normal. Septum midline. Mucosa normal. No drainage or sinus tenderness.  Throat: lips, mucosa, and tongue normal; teeth and gums normal  Neck: no adenopathy, no carotid bruit, no JVD, supple, symmetrical, trachea midline and thyroid not enlarged, symmetric, no tenderness/mass/nodules  No CVA tenderness.  Lungs: clear to auscultation bilaterally  Breasts: normal appearance, no masses or tenderness Heart: regular rate and rhythm, S1, S2 normal, no murmur, click, rub or gallop  Abdomen: soft, non-tender; bowel sounds normal; no masses, no organomegaly  Musculoskeletal: ROM normal in all joints, no crepitus, no deformity, Normal muscle strengthen. Back  is symmetric, no curvature. Skin: Skin color, texture, turgor normal. No rashes or lesions  Lymph nodes: Cervical, supraclavicular, and axillary nodes normal.  Neurologic: CN 2 -12 Normal, Normal symmetric reflexes. Normal coordination and gait  Psych: Alert & Oriented  x 3, Mood appear stable.    Assessment:    Annual wellness medicare exam   Plan:    During the course of the visit the patient was educated and counseled about appropriate screening and preventive services including:   Annual mammogram  Flu vaccine     Patient Instructions (the written plan) was given to the patient.  Medicare Attestation  I have personally reviewed:  The patient's medical and social history  Their use of alcohol, tobacco or illicit drugs  Their current medications and supplements  The patient's functional ability including ADLs,fall risks, home safety risks, cognitive, and hearing and visual impairment  Diet and physical activities    Evidence for depression or mood disorders  The patient's weight, height, BMI, and visual acuity have been recorded in the chart. I have made referrals, counseling, and provided education to the patient based on review of the above and I have provided the patient with a written personalized care plan for preventive services.

## 2015-07-03 ENCOUNTER — Other Ambulatory Visit: Payer: Self-pay | Admitting: *Deleted

## 2015-07-03 MED ORDER — JANUVIA 100 MG PO TABS: 100.0000 mg | ORAL_TABLET | Freq: Every morning | ORAL | Status: DC

## 2015-07-03 NOTE — Telephone Encounter (Signed)
Januvia refilled

## 2015-08-18 ENCOUNTER — Other Ambulatory Visit: Payer: Self-pay

## 2015-08-18 DIAGNOSIS — Z1231 Encounter for screening mammogram for malignant neoplasm of breast: Secondary | ICD-10-CM

## 2015-09-11 ENCOUNTER — Ambulatory Visit
Admission: RE | Admit: 2015-09-11 | Discharge: 2015-09-11 | Disposition: A | Discharge status: Home / Self Care | Payer: Medicare Other | Source: Ambulatory Visit

## 2015-09-11 DIAGNOSIS — Z1231 Encounter for screening mammogram for malignant neoplasm of breast: Secondary | ICD-10-CM

## 2015-09-22 ENCOUNTER — Other Ambulatory Visit: Payer: Self-pay

## 2015-09-22 MED ORDER — JANUVIA 100 MG PO TABS: 100.0000 mg | ORAL_TABLET | Freq: Every morning | ORAL | Status: DC

## 2015-09-23 ENCOUNTER — Other Ambulatory Visit: Payer: Medicare Other | Admitting: Internal Medicine

## 2015-09-23 DIAGNOSIS — E118 Type 2 diabetes mellitus with unspecified complications: Secondary | ICD-10-CM

## 2015-09-23 LAB — HEMOGLOBIN A1C
Hgb A1c MFr Bld: 5.8 % — ABNORMAL HIGH (ref <5.7)
Mean Plasma Glucose: 120 mg/dL — ABNORMAL HIGH (ref <117)

## 2015-09-30 ENCOUNTER — Encounter: Payer: Self-pay | Admitting: Internal Medicine

## 2015-09-30 ENCOUNTER — Ambulatory Visit (INDEPENDENT_AMBULATORY_CARE_PROVIDER_SITE_OTHER): Payer: Medicare Other | Admitting: Internal Medicine

## 2015-09-30 VITALS — BP 112/64 | HR 78 | Temp 97.7°F | Resp 20 | Ht 65.0 in | Wt 152.0 lb

## 2015-09-30 DIAGNOSIS — L659 Nonscarring hair loss, unspecified: Secondary | ICD-10-CM | POA: Diagnosis not present

## 2015-09-30 DIAGNOSIS — Z23 Encounter for immunization: Secondary | ICD-10-CM

## 2015-09-30 LAB — TSH: TSH: 2.054 u[IU]/mL (ref 0.350–4.500)

## 2015-09-30 LAB — T4, FREE: Free T4: 1.14 ng/dL (ref 0.80–1.80)

## 2015-09-30 NOTE — Progress Notes (Signed)
   Subjective:    Patient ID: Natalie [name], female    DOB: September 14, 1946, 69 y.o.   MRN: [id number]  HPI 69 year old White Female in today to follow-up on uncontrolled type 2 diabetes mellitus. She's been religiously checking Accu-Cheks before breakfast and before supper. Following a strict diabetic diet. This shows considerable improvement with hemoglobin A1c being 5.8%. I'm very pleased with her progress. When she started with new onset diabetes hemoglobin A1c was 13.4 % 4 months ago.  She's been having some issues with thinning of her hair and hair loss. Hairdresser has recommended biotin. We did check her TSH in August and it was in the 3 range. Recoiled repeat that today.    Review of Systems     Objective:   Physical Exam Thinning hair noted. Not examined. Spent 25 minutes speaking with patient about diabetic issues and hair loss.       Assessment & Plan:  Controlled type 2 diabetes mellitus with excellent control on Januvia  Hair loss-could be stress due to new onset diabetes, doubt it is related to Januvia. Thyroid functions pending.  Health maintenance-Prevnar given.  Plan: Thyroid functions drawn.  Return in 4 months for follow-up with hemoglobin A1c.

## 2015-09-30 NOTE — Patient Instructions (Signed)
May reduce Accu-Cheks to once a day before breakfast or before supper. Continue same medications. May take biotin for hair loss. Thyroid functions pending. Prevnar given. Return in 4 months.

## 2015-10-01 ENCOUNTER — Encounter: Payer: Self-pay | Admitting: Internal Medicine

## 2015-10-01 DIAGNOSIS — E119 Type 2 diabetes mellitus without complications: Secondary | ICD-10-CM | POA: Insufficient documentation

## 2015-10-01 NOTE — Patient Instructions (Signed)
Continue checking Accu-Cheks twice daily and Januvia daily. Return in November. I'm pleased with her progress. Triglycerides are now normal. Hemoglobin A1c has improved 8.9% from 13.4% which is excellent.

## 2015-10-29 ENCOUNTER — Encounter: Payer: Medicare Other | Attending: Internal Medicine

## 2015-10-29 VITALS — Wt 153.1 lb

## 2015-10-29 DIAGNOSIS — E119 Type 2 diabetes mellitus without complications: Secondary | ICD-10-CM | POA: Diagnosis present

## 2015-10-29 DIAGNOSIS — Z713 Dietary counseling and surveillance: Secondary | ICD-10-CM | POA: Insufficient documentation

## 2015-10-29 NOTE — Progress Notes (Signed)
Appt start time: 0910 end time:  0950.  Patient was seen on 10/29/2015 for a review of the series of three diabetes self-management courses at the [facility]. The following learning objectives were met by the patient during this class:  . Reviewed blood glucose monitoring and interpretation including the recommended target ranges and Hgb A1c.  . Reviewed on carb counting, importance of regularly scheduled meals/snacks, and meal planning.  . Reviewed the effects of physical activity on glucose levels and long-term glucose control.  Recommended goal of 150 minutes of physical activity/week. . Reviewed patient medications and discussed role of medication on blood glucose and possible side effects. . Discussed strategies to manage stress, psychosocial issues, and other obstacles to diabetes management. . Encouraged moderate weight reduction to improve glucose levels.   . Reviewed short-term complications: hyper- and hypo-glycemia.  Discussed causes, symptoms, and treatment options. . Reviewed prevention, detection, and treatment of long-term complications.  Discussed the role of prolonged elevated glucose levels on body systems.  Goals:  Follow Diabetes Meal Plan as instructed  Eat 3 meals and 2 snacks, every 3-5 hrs  Limit carbohydrate intake to 45 grams carbohydrate/meal Limit carbohydrate intake to 15 grams carbohydrate/snack Add lean protein foods to meals/snacks  Monitor glucose levels as instructed by your doctor  Aim for goal of 15-30 mins of physical activity daily as tolerated  Bring food record and glucose log to your next nutrition visit

## 2016-01-12 ENCOUNTER — Other Ambulatory Visit: Payer: PPO | Admitting: Internal Medicine

## 2016-01-12 DIAGNOSIS — E119 Type 2 diabetes mellitus without complications: Secondary | ICD-10-CM | POA: Diagnosis not present

## 2016-01-12 LAB — HEMOGLOBIN A1C
Hgb A1c MFr Bld: 5.7 % — ABNORMAL HIGH (ref <5.7)
Mean Plasma Glucose: 117 mg/dL — ABNORMAL HIGH (ref <117)

## 2016-01-15 ENCOUNTER — Encounter: Payer: Self-pay | Admitting: Internal Medicine

## 2016-01-15 ENCOUNTER — Ambulatory Visit (INDEPENDENT_AMBULATORY_CARE_PROVIDER_SITE_OTHER): Payer: PPO | Admitting: Internal Medicine

## 2016-01-15 VITALS — BP 138/78 | HR 74 | Temp 97.8°F | Resp 18 | Ht 65.0 in | Wt 147.5 lb

## 2016-01-15 DIAGNOSIS — R7302 Impaired glucose tolerance (oral): Secondary | ICD-10-CM

## 2016-01-15 MED ORDER — JANUVIA 100 MG PO TABS: 100.0000 mg | ORAL_TABLET | Freq: Every morning | ORAL | Status: DC

## 2016-01-15 NOTE — Patient Instructions (Addendum)
RTC around August 30 for CPE.  May change glucose checks to 3 times weekly i.e. every other day. Reminded about annual diabetic eye exam. Continue Januvia.

## 2016-01-15 NOTE — Progress Notes (Signed)
   Subjective:    Patient ID: Natalie [name], female    DOB: [birth year], 70 y.o.   MRN: [id number]  HPI  70 year old Female for follow up on Type 2 diabetes mellitus. She is on Januvia. She is following a strict diabetic diet. She's taking Accu-Cheks once daily. Hemoglobin A1c is excellent at 5.7%. No new complaints or problems. Has not had respiratory infection this winter. Feels well. Exercising some. Limits herself  to one alcoholic drink when she goes out at night on the weekends.    Review of Systems     Objective:   Physical Exam Neck is supple without JVD, thyromegaly, or carotid bruits. Chest clear to auscultation. Cardiac exam regular rate and rhythm normal S1 and S2. Extremities without edema.       Assessment & Plan:  Excellent control of type 2 diabetes mellitus with Hemoglobin A1c now 5.7% on Januvia  Plan: Patient will return late August for physical examination. Reminded about annual diabetic eye exam. Has not fallen and is not depressed. Continue Januvia. May decrease Accu-Cheks to every other day as long she continues to watch her diet and exercise.  Will need pneumococcal 23 when she returns in August.

## 2016-03-31 ENCOUNTER — Other Ambulatory Visit: Payer: Self-pay

## 2016-03-31 MED ORDER — JANUVIA 100 MG PO TABS: 100.0000 mg | ORAL_TABLET | Freq: Every morning | ORAL | Status: DC

## 2016-06-24 ENCOUNTER — Other Ambulatory Visit: Payer: PPO | Admitting: Internal Medicine

## 2016-06-24 DIAGNOSIS — E119 Type 2 diabetes mellitus without complications: Secondary | ICD-10-CM

## 2016-06-24 DIAGNOSIS — Z0000 Encounter for general adult medical examination without abnormal findings: Secondary | ICD-10-CM

## 2016-06-24 DIAGNOSIS — F419 Anxiety disorder, unspecified: Secondary | ICD-10-CM

## 2016-06-24 LAB — COMPLETE METABOLIC PANEL WITHOUT GFR
ALT: 14 U/L (ref 6–29)
AST: 19 U/L (ref 10–35)
Albumin: 4.1 g/dL (ref 3.6–5.1)
Alkaline Phosphatase: 49 U/L (ref 33–130)
BUN: 17 mg/dL (ref 7–25)
CO2: 22 mmol/L (ref 20–31)
Calcium: 9.5 mg/dL (ref 8.6–10.4)
Chloride: 108 mmol/L (ref 98–110)
Creat: 0.65 mg/dL (ref 0.50–0.99)
GFR, Est African American: >89 mL/min
GFR, Est Non African American: >89 mL/min
Glucose, Bld: 90 mg/dL (ref 65–99)
Potassium: 4.3 mmol/L (ref 3.5–5.3)
Sodium: 143 mmol/L (ref 135–146)
Total Bilirubin: 0.5 mg/dL (ref 0.2–1.2)
Total Protein: 6.8 g/dL (ref 6.1–8.1)

## 2016-06-24 LAB — CBC WITH DIFFERENTIAL/PLATELET
Basophils Absolute: 56 {cells}/uL (ref 0–200)
Basophils Relative: 1 %
Eosinophils Absolute: 168 {cells}/uL (ref 15–500)
Eosinophils Relative: 3 %
HCT: 41.1 % (ref 35.0–45.0)
Hemoglobin: 13.8 g/dL (ref 11.7–15.5)
Lymphocytes Relative: 26 %
Lymphs Abs: 1456 {cells}/uL (ref 850–3900)
MCH: 29.2 pg (ref 27.0–33.0)
MCHC: 33.6 g/dL (ref 32.0–36.0)
MCV: 87.1 fL (ref 80.0–100.0)
MPV: 10.8 fL (ref 7.5–12.5)
Monocytes Absolute: 616 {cells}/uL (ref 200–950)
Monocytes Relative: 11 %
Neutro Abs: 3304 {cells}/uL (ref 1500–7800)
Neutrophils Relative %: 59 %
Platelets: 182 K/uL (ref 140–400)
RBC: 4.72 MIL/uL (ref 3.80–5.10)
RDW: 14.5 % (ref 11.0–15.0)
WBC: 5.6 K/uL (ref 3.8–10.8)

## 2016-06-24 LAB — LIPID PANEL
CHOLESTEROL: 159 mg/dL (ref 125–200)
HDL: 54 mg/dL (ref >=46)
LDL CALC: 83 mg/dL (ref <130)
Total CHOL/HDL Ratio: 2.9 Ratio (ref <=5.0)
Triglycerides: 110 mg/dL (ref <150)
VLDL: 22 mg/dL (ref <30)

## 2016-06-24 LAB — TSH: TSH: 2.52 m[IU]/L

## 2016-06-25 LAB — VITAMIN D 25 HYDROXY (VIT D DEFICIENCY, FRACTURES): Vit D, 25-Hydroxy: 50 ng/mL (ref 30–100)

## 2016-06-25 LAB — MICROALBUMIN / CREATININE URINE RATIO
Creatinine, Urine: 201 mg/dL (ref 20–320)
MICROALB UR: 1.6 mg/dL
MICROALB/CREAT RATIO: 8 ug/mg{creat} (ref <30)

## 2016-06-25 LAB — HEMOGLOBIN A1C
HEMOGLOBIN A1C: 5.2 % (ref <5.7)
Mean Plasma Glucose: 103 mg/dL

## 2016-06-29 ENCOUNTER — Ambulatory Visit (INDEPENDENT_AMBULATORY_CARE_PROVIDER_SITE_OTHER): Payer: PPO | Admitting: Internal Medicine

## 2016-06-29 ENCOUNTER — Encounter: Payer: Self-pay | Admitting: Internal Medicine

## 2016-06-29 VITALS — BP 120/64 | HR 68 | Temp 98.3°F | Ht 65.0 in | Wt 147.5 lb

## 2016-06-29 DIAGNOSIS — R7302 Impaired glucose tolerance (oral): Secondary | ICD-10-CM

## 2016-06-29 DIAGNOSIS — Z23 Encounter for immunization: Secondary | ICD-10-CM

## 2016-06-29 DIAGNOSIS — Z0000 Encounter for general adult medical examination without abnormal findings: Secondary | ICD-10-CM | POA: Diagnosis not present

## 2016-06-29 LAB — POCT URINALYSIS DIPSTICK
BILIRUBIN UA: NEGATIVE
Blood, UA: NEGATIVE
GLUCOSE UA: NEGATIVE
KETONES UA: NEGATIVE
Leukocytes, UA: NEGATIVE
Nitrite, UA: NEGATIVE
Protein, UA: NEGATIVE
SPEC GRAV UA: 1.02
Urobilinogen, UA: 0.2
pH, UA: 5

## 2016-06-29 MED ORDER — SITAGLIPTIN PHOSPHATE 50 MG PO TABS: 50.0000 mg | ORAL_TABLET | Freq: Every day | ORAL | 0 refills | Status: DC

## 2016-06-29 NOTE — Patient Instructions (Signed)
Change Januvia from 100-50 mg daily. Return in November for hemoglobin A1c and pneumococcal 23 vaccine. Flu vaccine given today.

## 2016-06-29 NOTE — Progress Notes (Signed)
Subjective:    Patient ID: Natalie [name], female    DOB: [birth year], 70 y.o.   MRN: [id number]  HPI Pleasant 70 year old Female in today for Medicare wellness exam and evaluation of medical issues. History of type 2 diabetes mellitus. She's done excellent with diabetic control and is on Januvia 100 mg daily.Hemoglobin A1c 5.2%. She's been very strict with her diet would like to liberalize it a bit. I think we can cut down on Januvia to 50 mg daily and follow-up in 3 months. We discussed Prevnar 23 today and she declines. She declines zoster vaccine. Tetanus immunization is up-to-date.  Diabetes was diagnosed July 2016 after presenting with urinary frequency. Hemoglobin A1c was 13.4%. We treated her as an outpatient with regular insulin until we got diabetes under control. She went diabetes treatment Center. Was subsequently changed to [place].  History of anxiety and hypertriglyceridemia.  Past medical history: Anxiety is especially bothersome with airplane flight. History of fractured foot secondary to fall in 1986. History of right shoulder adhesive capsulitis November 2010. History of vaginal hysterectomy with enterocele and rectocele repair 2004. Had colonoscopy May 2012 showing hyperplastic polyps. Was hospitalized Spring 2012 after a bout of gastroneuritis resulting in a partial small bowel obstruction. Was treated with IV fluids, bowel rest and subsequently obstruction resolved.  Does better with thallium and Xanax for anxiety she says.  She takes calcium and vitamin D.  No known drug allergies.  Social history: She is married. Husband operates [employer] They reside in [place]. She does not smoke. Social alcohol consumption. She has college education. Has not worked outside the home since 1986. 2 adult children.  Family history: Father died at age 81 due to diffuse alveolar damage of the lung. Mother died at age 72 of complications of pneumonia. One sister in good  health.    Review of Systems  Constitutional: Negative.   All other systems reviewed and are negative.      Objective:   Physical Exam  Constitutional: She appears well-developed and well-nourished. No distress.  HENT:  Head: Normocephalic and atraumatic.  Right Ear: External ear normal.  Left Ear: External ear normal.  Mouth/Throat: Oropharynx is clear and moist. No oropharyngeal exudate.  Eyes: Conjunctivae and EOM are normal. Pupils are equal, round, and reactive to light. Right eye exhibits no discharge. Left eye exhibits no discharge.  Neck: Neck supple. No JVD present. No thyromegaly present.  Cardiovascular: Normal rate, regular rhythm, normal heart sounds and intact distal pulses.   No murmur heard. Pulmonary/Chest: Effort normal and breath sounds normal. She has no wheezes.  Abdominal: She exhibits no distension and no mass. There is no tenderness. There is no rebound and no guarding.  Genitourinary:  Genitourinary Comments: Bimanual normal. Pap deferred  Musculoskeletal: She exhibits no edema.  Lymphadenopathy:    She has no cervical adenopathy.  Neurological: No cranial nerve deficit. Coordination normal.  Skin: Skin is warm and dry. No rash noted. She is not diaphoretic.  Psychiatric: She has a normal mood and affect. Her behavior is normal. Judgment and thought content normal.  Vitals reviewed.         Assessment & Plan:  Controlled Type 2 diabetes mellitus-now has hemoglobin A1c 5.2% on Januvia 100 mg daily. Decrease Januvia to 50 mg daily and follow-up in November with nurse visit, hemoglobin A1c and pneumococcal 23 vaccine. Flu vaccine given today.Previous hemoglobin A1c in March was 5.7%  Anxiety-stable at present time  History of hypertriglyceridemia-lipid panel is normal  Plan: Return in November for hemoglobin A1c on reduced dose of Januvia and Prevnar 23. Next 6 month checkup due February 2018  Subjective:   Patient presents for Medicare  Annual/Subsequent preventive examination.  Review Past Medical/Family/Social: See above  Risk Factors  Current exercise habits: Walks Dietary issues discussed: Following low-fat low carb diet  Cardiac risk factors: Diabetes  Depression Screen  (Note: if answer to either of the following is "Yes", a more complete depression screening is indicated)   Over the past two weeks, have you felt down, depressed or hopeless? No  Over the past two weeks, have you felt little interest or pleasure in doing things? No Have you lost interest or pleasure in daily life? No Do you often feel hopeless? No Do you cry easily over simple problems? No   Activities of Daily Living  In your present state of health, do you have any difficulty performing the following activities?:   Driving? No  Managing money? No  Feeding yourself? No  Getting from bed to chair? No  Climbing a flight of stairs? No  Preparing food and eating?: No  Bathing or showering? No  Getting dressed: No  Getting to the toilet? No  Using the toilet:No  Moving around from place to place: No  In the past year have you fallen or had a near fall?:No  Are you sexually active? yes Do you have more than one partner? No   Hearing Difficulties: No  Do you often ask people to speak up or repeat themselves? No  Do you experience ringing or noises in your ears? No  Do you have difficulty understanding soft or whispered voices? No  Do you feel that you have a problem with memory? No Do you often misplace items? No    Home Safety:  Do you have a smoke alarm at your residence? Yes Do you have grab bars in the bathroom? No Do you have throw rugs in your house? No   Cognitive Testing  Alert? Yes Normal Appearance?Yes  Oriented to person? Yes Place? Yes  Time? Yes  Recall of three objects? Yes  Can perform simple calculations? Yes  Displays appropriate judgment?Yes  Can read the correct time from a watch face?Yes   List the  Names of Other Physician/Practitioners you currently use:  See referral list for the physicians patient is currently seeing.     Review of Systems: As above   Objective:     General appearance: Appears stated age and mildly obese  Head: Normocephalic, without obvious abnormality, atraumatic  Eyes: conj clear, EOMi PEERLA  Ears: normal TM's and external ear canals both ears  Nose: Nares normal. Septum midline. Mucosa normal. No drainage or sinus tenderness.  Throat: lips, mucosa, and tongue normal; teeth and gums normal  Neck: no adenopathy, no carotid bruit, no JVD, supple, symmetrical, trachea midline and thyroid not enlarged, symmetric, no tenderness/mass/nodules  No CVA tenderness.  Lungs: clear to auscultation bilaterally  Breasts: normal appearance, no masses or tenderness,  Heart: regular rate and rhythm, S1, S2 normal, no murmur, click, rub or gallop  Abdomen: soft, non-tender; bowel sounds normal; no masses, no organomegaly  Musculoskeletal: ROM normal in all joints, no crepitus, no deformity, Normal muscle strengthen. Back  is symmetric, no curvature. Skin: Skin color, texture, turgor normal. No rashes or lesions  Lymph nodes: Cervical, supraclavicular, and axillary nodes normal.  Neurologic: CN 2 -12 Normal, Normal symmetric reflexes. Normal coordination and gait  Psych: Alert & Oriented x 3,  Mood appear stable.    Assessment:    Annual wellness medicare exam   Plan:    During the course of the visit the patient was educated and counseled about appropriate screening and preventive services including:   Annual mammogram  Annual flu vaccine-given  Prevnar 13 to be given in November  Annual eye exam  Declines Zostavax vaccine     Patient Instructions (the written plan) was given to the patient.  Medicare Attestation  I have personally reviewed:  The patient's medical and social history  Their use of alcohol, tobacco or illicit drugs  Their current  medications and supplements  The patient's functional ability including ADLs,fall risks, home safety risks, cognitive, and hearing and visual impairment  Diet and physical activities  Evidence for depression or mood disorders  The patient's weight, height, BMI, and visual acuity have been recorded in the chart. I have made referrals, counseling, and provided education to the patient based on review of the above and I have provided the patient with a written personalized care plan for preventive services.

## 2016-08-25 ENCOUNTER — Other Ambulatory Visit: Payer: Self-pay | Admitting: Internal Medicine

## 2016-08-25 DIAGNOSIS — Z1231 Encounter for screening mammogram for malignant neoplasm of breast: Secondary | ICD-10-CM

## 2016-09-13 ENCOUNTER — Ambulatory Visit (INDEPENDENT_AMBULATORY_CARE_PROVIDER_SITE_OTHER): Payer: PPO | Admitting: Internal Medicine

## 2016-09-13 DIAGNOSIS — E119 Type 2 diabetes mellitus without complications: Secondary | ICD-10-CM | POA: Diagnosis not present

## 2016-09-13 DIAGNOSIS — Z23 Encounter for immunization: Secondary | ICD-10-CM

## 2016-09-13 LAB — HEMOGLOBIN A1C
HEMOGLOBIN A1C: 5.2 % (ref <5.7)
Mean Plasma Glucose: 103 mg/dL

## 2016-09-16 ENCOUNTER — Ambulatory Visit
Admission: RE | Admit: 2016-09-16 | Discharge: 2016-09-16 | Disposition: A | Discharge status: Home / Self Care | Payer: PPO | Source: Ambulatory Visit | Attending: Internal Medicine | Admitting: Internal Medicine

## 2016-09-16 DIAGNOSIS — Z1231 Encounter for screening mammogram for malignant neoplasm of breast: Secondary | ICD-10-CM

## 2016-09-21 DIAGNOSIS — L719 Rosacea, unspecified: Secondary | ICD-10-CM | POA: Diagnosis not present

## 2016-09-21 DIAGNOSIS — D1801 Hemangioma of skin and subcutaneous tissue: Secondary | ICD-10-CM | POA: Diagnosis not present

## 2016-09-21 DIAGNOSIS — L821 Other seborrheic keratosis: Secondary | ICD-10-CM | POA: Diagnosis not present

## 2016-09-21 DIAGNOSIS — L579 Skin changes due to chronic exposure to nonionizing radiation, unspecified: Secondary | ICD-10-CM | POA: Diagnosis not present

## 2016-09-21 DIAGNOSIS — L814 Other melanin hyperpigmentation: Secondary | ICD-10-CM | POA: Diagnosis not present

## 2016-09-21 DIAGNOSIS — L2082 Flexural eczema: Secondary | ICD-10-CM | POA: Diagnosis not present

## 2016-10-07 ENCOUNTER — Encounter: Payer: Self-pay | Admitting: Internal Medicine

## 2016-10-07 ENCOUNTER — Ambulatory Visit (INDEPENDENT_AMBULATORY_CARE_PROVIDER_SITE_OTHER): Payer: PPO | Admitting: Internal Medicine

## 2016-10-07 VITALS — BP 110/62 | HR 76 | Temp 99.8°F | Ht 65.0 in | Wt 146.0 lb

## 2016-10-07 DIAGNOSIS — J069 Acute upper respiratory infection, unspecified: Secondary | ICD-10-CM | POA: Diagnosis not present

## 2016-10-07 MED ORDER — AZITHROMYCIN 250 MG PO TABS: ORAL_TABLET | ORAL | 0 refills | Status: DC

## 2016-10-07 MED ORDER — BENZONATATE 100 MG PO CAPS: 100.0000 mg | ORAL_CAPSULE | Freq: Three times a day (TID) | ORAL | 0 refills | Status: DC | PRN

## 2016-10-07 NOTE — Progress Notes (Signed)
   Subjective:    Patient ID: Natalie [name], female    DOB: Dec 09, 1945, 70 y.o.   MRN: [id number]  HPI  Onset 2 weeks  ago of URI symptoms. Has had cough congestion and hoarseness. No fever or shaking chills.    Review of Systems     Objective:   Physical Exam Skin warm and dry. Sounds a bit hoarse. TMs are slightly full. Pharynx very slightly injected without exudate. Neck is supple. Chest clear to auscultation without rales or wheezing.       Assessment & Plan:  Acute upper respiratory infection  Plan: Zithromax Z-PAK take 2 tablets day one followed by 1 tablet days 2 through 5. Tessalon Perles 100 mg 3 times daily as needed for cough. Rest and drink plenty of fluids.

## 2016-10-30 NOTE — Patient Instructions (Signed)
Take Zithromax Z-PAK as directed. Tessalon Perles 100 mg 3 times daily as needed for cough. Rest and drink plenty of fluids.

## 2016-11-02 ENCOUNTER — Telehealth: Payer: Self-pay | Admitting: Internal Medicine

## 2016-11-02 ENCOUNTER — Encounter: Payer: Self-pay | Admitting: Internal Medicine

## 2016-11-02 ENCOUNTER — Ambulatory Visit: Payer: PPO | Admitting: Internal Medicine

## 2016-11-02 ENCOUNTER — Ambulatory Visit (INDEPENDENT_AMBULATORY_CARE_PROVIDER_SITE_OTHER): Payer: PPO | Admitting: Internal Medicine

## 2016-11-02 VITALS — BP 104/62 | HR 71 | Temp 98.5°F | Wt 147.0 lb

## 2016-11-02 DIAGNOSIS — N611 Abscess of the breast and nipple: Secondary | ICD-10-CM | POA: Diagnosis not present

## 2016-11-02 MED ORDER — SITAGLIPTIN PHOSPHATE 50 MG PO TABS: 50.0000 mg | ORAL_TABLET | Freq: Every day | ORAL | 1 refills | Status: DC

## 2016-11-02 MED ORDER — CEPHALEXIN 500 MG PO CAPS: 500.0000 mg | ORAL_CAPSULE | Freq: Four times a day (QID) | ORAL | 0 refills | Status: DC

## 2016-11-02 NOTE — Telephone Encounter (Signed)
Refill Januvia for 6 months.

## 2016-11-03 NOTE — Progress Notes (Signed)
   Subjective:    Patient ID: Natalie [name], female    DOB: 12-08-1945, 71 y.o.   MRN: [id number]  HPI  Patient noted small lesion at about 6:00 right breast which is superficial. She became alarmed that it could represent some type of cancer. She came to the office urgently today. It is not draining or significantly tender.  Recent respiratory infection resolved.  Review of Systems see above     Objective:   Physical Exam Proximal 4 mm circular slightly raised erythematous lesion with central opening that is not draining. This is all superficial and consistent with a small pimple       Assessment & Plan:  Breast pimple  Plan: Hot compresses 20 minutes twice daily. Keflex 500 mg 4 times daily for 7 days.

## 2016-11-03 NOTE — Patient Instructions (Signed)
Keflex 500 mg 4 times daily for 7 days. Hot compresses 20 minutes twice daily.

## 2017-01-27 ENCOUNTER — Other Ambulatory Visit: Payer: Self-pay

## 2017-01-27 MED ORDER — SITAGLIPTIN PHOSPHATE 50 MG PO TABS: 50.0000 mg | ORAL_TABLET | Freq: Every day | ORAL | 99 refills | Status: DC

## 2017-06-27 ENCOUNTER — Other Ambulatory Visit: Payer: PPO | Admitting: Internal Medicine

## 2017-06-27 DIAGNOSIS — J309 Allergic rhinitis, unspecified: Secondary | ICD-10-CM | POA: Diagnosis not present

## 2017-06-27 DIAGNOSIS — Z0000 Encounter for general adult medical examination without abnormal findings: Secondary | ICD-10-CM

## 2017-06-27 DIAGNOSIS — F419 Anxiety disorder, unspecified: Secondary | ICD-10-CM

## 2017-06-27 DIAGNOSIS — E119 Type 2 diabetes mellitus without complications: Secondary | ICD-10-CM | POA: Diagnosis not present

## 2017-06-27 LAB — CBC WITH DIFFERENTIAL/PLATELET
Basophils Absolute: 63 cells/uL (ref 0–200)
Basophils Relative: 1 %
EOS ABS: 252 {cells}/uL (ref 15–500)
Eosinophils Relative: 4 %
HEMATOCRIT: 43.4 % (ref 35.0–45.0)
Hemoglobin: 14.5 g/dL (ref 11.7–15.5)
LYMPHS PCT: 26 %
Lymphs Abs: 1638 cells/uL (ref 850–3900)
MCH: 30 pg (ref 27.0–33.0)
MCHC: 33.4 g/dL (ref 32.0–36.0)
MCV: 89.9 fL (ref 80.0–100.0)
MPV: 10.2 fL (ref 7.5–12.5)
Monocytes Absolute: 567 cells/uL (ref 200–950)
Monocytes Relative: 9 %
NEUTROS PCT: 60 %
Neutro Abs: 3780 cells/uL (ref 1500–7800)
Platelets: 190 10*3/uL (ref 140–400)
RBC: 4.83 MIL/uL (ref 3.80–5.10)
RDW: 13.9 % (ref 11.0–15.0)
WBC: 6.3 10*3/uL (ref 3.8–10.8)

## 2017-06-28 ENCOUNTER — Other Ambulatory Visit: Payer: PPO | Admitting: Internal Medicine

## 2017-06-28 LAB — COMPLETE METABOLIC PANEL WITH GFR
ALK PHOS: 48 U/L (ref 33–130)
ALT: 12 U/L (ref 6–29)
AST: 17 U/L (ref 10–35)
Albumin: 4.5 g/dL (ref 3.6–5.1)
BILIRUBIN TOTAL: 0.8 mg/dL (ref 0.2–1.2)
BUN: 15 mg/dL (ref 7–25)
CO2: 24 mmol/L (ref 20–32)
CREATININE: 0.63 mg/dL (ref 0.50–0.99)
Calcium: 9.4 mg/dL (ref 8.6–10.4)
Chloride: 108 mmol/L (ref 98–110)
Glucose, Bld: 85 mg/dL (ref 65–99)
Potassium: 4.3 mmol/L (ref 3.5–5.3)
Sodium: 143 mmol/L (ref 135–146)
TOTAL PROTEIN: 7.1 g/dL (ref 6.1–8.1)

## 2017-06-28 LAB — LIPID PANEL
CHOL/HDL RATIO: 3 ratio (ref <5.0)
Cholesterol: 181 mg/dL (ref <200)
HDL: 60 mg/dL (ref >50)
LDL Cholesterol: 102 mg/dL — ABNORMAL HIGH (ref <100)
Triglycerides: 97 mg/dL (ref <150)
VLDL: 19 mg/dL (ref <30)

## 2017-06-28 LAB — MICROALBUMIN / CREATININE URINE RATIO
CREATININE, URINE: 239 mg/dL (ref 20–320)
MICROALB UR: 2 mg/dL
MICROALB/CREAT RATIO: 8 ug/mg{creat} (ref <30)

## 2017-06-28 LAB — HEMOGLOBIN A1C
Hgb A1c MFr Bld: 5.1 % (ref <5.7)
MEAN PLASMA GLUCOSE: 100 mg/dL

## 2017-06-28 LAB — TSH: TSH: 2.21 mIU/L

## 2017-06-30 ENCOUNTER — Telehealth: Payer: Self-pay

## 2017-06-30 ENCOUNTER — Ambulatory Visit (INDEPENDENT_AMBULATORY_CARE_PROVIDER_SITE_OTHER): Payer: PPO | Admitting: Internal Medicine

## 2017-06-30 ENCOUNTER — Encounter: Payer: Self-pay | Admitting: Internal Medicine

## 2017-06-30 VITALS — BP 100/58 | HR 72 | Temp 98.2°F | Ht 64.5 in | Wt 144.0 lb

## 2017-06-30 DIAGNOSIS — R7302 Impaired glucose tolerance (oral): Secondary | ICD-10-CM

## 2017-06-30 DIAGNOSIS — Z23 Encounter for immunization: Secondary | ICD-10-CM | POA: Diagnosis not present

## 2017-06-30 DIAGNOSIS — Z0000 Encounter for general adult medical examination without abnormal findings: Secondary | ICD-10-CM

## 2017-06-30 LAB — POCT URINALYSIS DIPSTICK
BILIRUBIN UA: NEGATIVE
Blood, UA: NEGATIVE
Glucose, UA: NEGATIVE
Ketones, UA: NEGATIVE
Leukocytes, UA: NEGATIVE
NITRITE UA: NEGATIVE
PH UA: 5 (ref 5.0–8.0)
PROTEIN UA: NEGATIVE
Spec Grav, UA: 1.015 (ref 1.010–1.025)
Urobilinogen, UA: 0.2 E.U./dL

## 2017-06-30 NOTE — Telephone Encounter (Signed)
LVM explaining all to pt.

## 2017-06-30 NOTE — Patient Instructions (Signed)
Patient will try stopping Januvia. She will test Accu-Cheks at least weekly. Has been doing 3 times weekly. She will call have Accu-Cheks increase. Otherwise return in 6 months.

## 2017-06-30 NOTE — Telephone Encounter (Signed)
Test glucose once a week. May try stopping Januvia but if glucose goes back up, will need it.

## 2017-06-30 NOTE — Progress Notes (Signed)
Subjective:    Patient ID: Natalie [name], female    DOB: 1945/12/17, 71 y.o.   MRN: [id number]  HPI 71 year old Female for health maintenance exam and evaluation of medical issues.Husband recently had another cardiac ablation in hopes of getting off of fatigue for son. She says he feels well and she took him home yesterday from the hospital. This is had her very anxious and worried. In addition he had a bout of pneumonia earlier this Summer.   Immunizations are up-to-date.   She has a history of type 2 diabetes mellitus in his done well with diabetic control. Currently on Januvia 50 mg daily. Hemoglobin A1c is 5.1%. Has been very strict her diet.  History of anxiety and hypertriglyceridemia.  Diabetes was diagnosed July 2016 after presenting with urinary frequency. Hemoglobin A1c was 13.4%. She was treated as an outpatient with regular insulin until we got diabetes under control. She went to diabetes treatment Center for dietary counseling and education. Subsequently was changed to [place] and his done very very well.  Past medical history: Anxiety is especially bothersome with airplane flight. History of fractured foot secondary to fall in 1986. History of right shoulder adhesive capsulitis November 2010. History of vaginal hysterectomy with enterocele and rectocele repair 2004. Had colonoscopy May 2012 showing hyperplastic polyps. Was hospitalized Spring 2012 after a bout of gastroenteritis resulting in a partial small bowel obstruction. Was treated with IV fluids, bowel rest and subsequently obstruction resolved.  She says she does better with Valium then with Xanax for anxiety.  Social history: She is married. Family operates [employer]. They reside in [place]. She does not smoke. Social alcohol consumption. 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 Has not worked outside the home since 1986. 2 adult children.  Family history: Father died at age 55 due to diffuse alveolar damage of the  lung. Mother died at age 40 of, occasion some pneumonia. One sister in good health.       Review of Systems  Constitutional: Negative.   All other systems reviewed and are negative.      Objective:   Physical Exam  Constitutional: She is oriented to person, place, and time. She appears well-developed and well-nourished. No distress.  HENT:  Head: Normocephalic and atraumatic.  Right Ear: External ear normal.  Left Ear: External ear normal.  Mouth/Throat: Oropharynx is clear and moist.  Eyes: Pupils are equal, round, and reactive to light. Conjunctivae are normal. Right eye exhibits no discharge.  Neck: Neck supple. No JVD present. No tracheal deviation present. No thyromegaly present.  Cardiovascular: Normal rate, regular rhythm, normal heart sounds and intact distal pulses.   No murmur heard. Pulmonary/Chest: Effort normal and breath sounds normal. No respiratory distress. She has no wheezes. She has no rales.  Breasts normal female without masses  Abdominal: Soft. Bowel sounds are normal. She exhibits no distension and no mass. There is no tenderness. There is no rebound and no guarding.  Genitourinary:  Genitourinary Comments: Pap deferred due to age. Bimanual normal.  Musculoskeletal: She exhibits no edema.  Lymphadenopathy:    She has no cervical adenopathy.  Neurological: She is alert and oriented to person, place, and time. She has normal reflexes. No cranial nerve deficit. Coordination normal.  Skin: Skin is warm and dry. No rash noted. She is not diaphoretic.  Psychiatric: She has a normal mood and affect. Her behavior is normal. Judgment and thought content normal.  Vitals reviewed.  Assessment & Plan:  History of diabetes mellitus on Januvia 50 mg daily now with excellent hemoglobin A1c at 5.1%. She may discontinue Januvia and follow-up in 6 months. She may decrease frequency of diabetic testing from 3 times to once a week. Continue diet exercise  regimen.  History of anxiety  History of hypertriglyceridemia-triglycerides are normal. LDL cholesterol 102 and total cholesterol normal at 181  Plan: Return in 6 months for office visit and hemoglobin A1c.  Subjective:   Patient presents for Medicare Annual/Subsequent preventive examination.   Risk Factors  Current exercise habits: Exercises regularly  Dietary issues discussed: Low fat /low carbohydrate  Cardiac risk factors:History of impaired glucose tolerance/diabetes mellitus  Depression Screen  (Note: if answer to either of the following is "Yes", a more complete depression screening is indicated)   Over the past two weeks, have you felt down, depressed or hopeless? No  Over the past two weeks, have you felt little interest or pleasure in doing things? No Have you lost interest or pleasure in daily life? No Do you often feel hopeless? No Do you cry easily over simple problems? No   Activities of Daily Living  In your present state of health, do you have any difficulty performing the following activities?:   Driving? No  Managing money? No  Feeding yourself? No  Getting from bed to chair? No  Climbing a flight of stairs? No  Preparing food and eating?: No  Bathing or showering? No  Getting dressed: No  Getting to the toilet? No  Using the toilet:No  Moving around from place to place: No  In the past year have you fallen or had a near fall?:No  Are you sexually active? yes Do you have more than one partner? No   Hearing Difficulties: No  Do you often ask people to speak up or repeat themselves? No  Do you experience ringing or noises in your ears? No  Do you have difficulty understanding soft or whispered voices? No  Do you feel that you have a problem with memory? No Do you often misplace items? No    Home Safety:  Do you have a smoke alarm at your residence? Yes Do you have grab bars in the bathroom? No Do you have throw rugs in your house?  No   Cognitive Testing  Alert? Yes Normal Appearance?Yes  Oriented to person? Yes Place? Yes  Time? Yes  Recall of three objects? Yes  Can perform simple calculations? Yes  Displays appropriate judgment?Yes  Can read the correct time from a watch face?Yes   List the Names of Other Physician/Practitioners you currently use:  See referral list for the physicians patient is currently seeing.     Review of Systems: See above   Objective:     General appearance: Appears younger than stated age  Head: Normocephalic, without obvious abnormality, atraumatic  Eyes: conj clear, EOMi PEERLA  Ears: normal TM's and external ear canals both ears  Nose: Nares normal. Septum midline. Mucosa normal. No drainage or sinus tenderness.  Throat: lips, mucosa, and tongue normal; teeth and gums normal  Neck: no adenopathy, no carotid bruit, no JVD, supple, symmetrical, trachea midline and thyroid not enlarged, symmetric, no tenderness/mass/nodules  No CVA tenderness.  Lungs: clear to auscultation bilaterally  Breasts: normal appearance, no masses or tenderness Heart: regular rate and rhythm, S1, S2 normal, no murmur, click, rub or gallop  Abdomen: soft, non-tender; bowel sounds normal; no masses, no organomegaly  Musculoskeletal: ROM normal in  all joints, no crepitus, no deformity, Normal muscle strengthen. Back  is symmetric, no curvature. Skin: Skin color, texture, turgor normal. No rashes or lesions  Lymph nodes: Cervical, supraclavicular, and axillary nodes normal.  Neurologic: CN 2 -12 Normal, Normal symmetric reflexes. Normal coordination and gait  Psych: Alert & Oriented x 3, Mood appear stable.    Assessment:    Annual wellness medicare exam   Plan:    During the course of the visit the patient was educated and counseled about appropriate screening and preventive services including:   Annual mammogram  Annual flu vaccine given     Patient Instructions (the written plan) was  given to the patient.  Medicare Attestation  I have personally reviewed:  The patient's medical and social history  Their use of alcohol, tobacco or illicit drugs  Their current medications and supplements  The patient's functional ability including ADLs,fall risks, home safety risks, cognitive, and hearing and visual impairment  Diet and physical activities  Evidence for depression or mood disorders  The patient's weight, height, BMI, and visual acuity have been recorded in the chart. I have made referrals, counseling, and provided education to the patient based on review of the above and I have provided the patient with a written personalized care plan for preventive services.

## 2017-06-30 NOTE — Telephone Encounter (Signed)
Pt would like to know, since her blood work was good could she stop testing her sugar 3x a week and can she stop Januvia? Please advise

## 2017-08-10 ENCOUNTER — Other Ambulatory Visit: Payer: Self-pay | Admitting: Internal Medicine

## 2017-08-10 DIAGNOSIS — Z1231 Encounter for screening mammogram for malignant neoplasm of breast: Secondary | ICD-10-CM

## 2017-08-19 ENCOUNTER — Ambulatory Visit (INDEPENDENT_AMBULATORY_CARE_PROVIDER_SITE_OTHER): Payer: PPO | Admitting: Internal Medicine

## 2017-08-19 ENCOUNTER — Encounter: Payer: Self-pay | Admitting: Internal Medicine

## 2017-08-19 ENCOUNTER — Ambulatory Visit
Admission: RE | Admit: 2017-08-19 | Discharge: 2017-08-19 | Disposition: A | Discharge status: Home / Self Care | Payer: PPO | Source: Ambulatory Visit | Attending: Internal Medicine | Admitting: Internal Medicine

## 2017-08-19 ENCOUNTER — Telehealth: Payer: Self-pay

## 2017-08-19 VITALS — BP 108/60 | HR 67 | Temp 98.1°F | Wt 146.0 lb

## 2017-08-19 DIAGNOSIS — R1031 Right lower quadrant pain: Secondary | ICD-10-CM

## 2017-08-19 DIAGNOSIS — R109 Unspecified abdominal pain: Secondary | ICD-10-CM | POA: Diagnosis not present

## 2017-08-19 DIAGNOSIS — R1030 Lower abdominal pain, unspecified: Secondary | ICD-10-CM

## 2017-08-19 DIAGNOSIS — R350 Frequency of micturition: Secondary | ICD-10-CM | POA: Diagnosis not present

## 2017-08-19 DIAGNOSIS — R1032 Left lower quadrant pain: Secondary | ICD-10-CM

## 2017-08-19 DIAGNOSIS — K8030 Calculus of bile duct with cholangitis, unspecified, without obstruction: Secondary | ICD-10-CM | POA: Diagnosis not present

## 2017-08-19 DIAGNOSIS — R10829 Rebound abdominal tenderness, unspecified site: Secondary | ICD-10-CM

## 2017-08-19 LAB — POCT URINALYSIS DIPSTICK
Bilirubin, UA: NEGATIVE
GLUCOSE UA: NEGATIVE
KETONES UA: NEGATIVE
LEUKOCYTES UA: NEGATIVE
Nitrite, UA: NEGATIVE
PROTEIN UA: NEGATIVE
SPEC GRAV UA: 1.025 (ref 1.010–1.025)
UROBILINOGEN UA: 0.2 U/dL
pH, UA: 7 (ref 5.0–8.0)

## 2017-08-19 LAB — CBC WITH DIFFERENTIAL/PLATELET
Basophils Absolute: 62 cells/uL (ref 0–200)
Basophils Relative: 0.9 %
Eosinophils Absolute: 262 cells/uL (ref 15–500)
Eosinophils Relative: 3.8 %
HCT: 36.7 % (ref 35.0–45.0)
Hemoglobin: 12.6 g/dL (ref 11.7–15.5)
Lymphs Abs: 1842 cells/uL (ref 850–3900)
MCH: 29.6 pg (ref 27.0–33.0)
MCHC: 34.3 g/dL (ref 32.0–36.0)
MCV: 86.2 fL (ref 80.0–100.0)
MONOS PCT: 10 %
MPV: 10.6 fL (ref 7.5–12.5)
NEUTROS PCT: 58.6 %
Neutro Abs: 4043 cells/uL (ref 1500–7800)
PLATELETS: 197 10*3/uL (ref 140–400)
RBC: 4.26 10*6/uL (ref 3.80–5.10)
RDW: 12.9 % (ref 11.0–15.0)
TOTAL LYMPHOCYTE: 26.7 %
WBC: 6.9 10*3/uL (ref 3.8–10.8)
WBCMIX: 690 {cells}/uL (ref 200–950)

## 2017-08-19 MED ORDER — METRONIDAZOLE 500 MG PO TABS: 500.0000 mg | ORAL_TABLET | Freq: Three times a day (TID) | ORAL | 0 refills | Status: DC

## 2017-08-19 MED ORDER — CIPROFLOXACIN HCL 500 MG PO TABS: 500.0000 mg | ORAL_TABLET | Freq: Two times a day (BID) | ORAL | 0 refills | Status: DC

## 2017-08-19 NOTE — Progress Notes (Deleted)
   Subjective:    Patient ID: Natalie [name], female    DOB: 14-Jan-1946, 71 y.o.   MRN: [id number]  HPI    Review of Systems     Objective:   Physical Exam        Assessment & Plan:

## 2017-08-19 NOTE — Patient Instructions (Signed)
CBC with differential drawn stat. Urine sent for culture. CT of abdomen and pelvis ordered.

## 2017-08-19 NOTE — Progress Notes (Signed)
   Subjective:    Patient ID: Natalie [name], female    DOB: 04-30-1946, 71 y.o.   MRN: [id number]  HPI Patient was at the beach this week. She got up from bed to go to the bathroom and had a near syncopal episode. She is not sure what provoked it. She has a history of impaired glucose tolerance. Did not have home glucose monitor to check glucose.  Later broke out into a sweat all over which was unusual. No shaking chills. Yesterday had urinary frequency which was unusual. No Jewish area. No back pain. Has developed lower abdominal pain. Had a sharp pain in lower abdomen yesterday returning from the beach which has persisted. Felt a bit nauseated but no vomiting.  Has not had any issues like this previously.    Review of Systems see above     Objective:   Physical Exam Skin warm and dry. Chest clear. Cardiac exam regular rate and rhythm. Abdomen: Bowel sounds are increased throughout. Abdomen seems slightly distended. No hepatosplenomegaly or palpable masses. However she is quite tender in her lower abdomen below her umbilicus. Rebound tenderness is present.  Dipstick UA shows small amount of occult blood. Urine sent for culture.       Assessment & Plan:  Acute lower abdominal pain with rebound tenderness Possible acute abdomen  ? Diverticulitis? Kidney stone  Plan: Patient needs to have CT of the abdomen and pelvis as soon as possible. Stat CBC with differential drawn.

## 2017-08-19 NOTE — Telephone Encounter (Signed)
-----   Message from Elby Showers, MD sent at 08/19/2017  2:16 PM EDT ----- Spoke with patient. Given results and dietary instructions. She will be leaving town Wednesday but agrees to return here for follow up Wed am at 10 am. Please call in Flagyl 500 mg #21 one po tid x 7 days  and Cipro 500 mg bid x 10 days #20.

## 2017-08-19 NOTE — Addendum Note (Signed)
Addended by: Drucilla Schmidt on: 08/19/2017 01:02 PM   Modules accepted: Orders

## 2017-08-22 LAB — URINE CULTURE
MICRO NUMBER:: 81172001
SPECIMEN QUALITY:: ADEQUATE

## 2017-08-24 ENCOUNTER — Ambulatory Visit (INDEPENDENT_AMBULATORY_CARE_PROVIDER_SITE_OTHER): Payer: PPO | Admitting: Internal Medicine

## 2017-08-24 ENCOUNTER — Encounter: Payer: Self-pay | Admitting: Internal Medicine

## 2017-08-24 VITALS — BP 100/58 | HR 80 | Temp 97.8°F | Wt 142.0 lb

## 2017-08-24 DIAGNOSIS — E119 Type 2 diabetes mellitus without complications: Secondary | ICD-10-CM

## 2017-08-24 DIAGNOSIS — K5732 Diverticulitis of large intestine without perforation or abscess without bleeding: Secondary | ICD-10-CM | POA: Insufficient documentation

## 2017-08-24 DIAGNOSIS — N390 Urinary tract infection, site not specified: Secondary | ICD-10-CM

## 2017-08-24 LAB — POCT URINALYSIS DIPSTICK
Bilirubin, UA: NEGATIVE
Blood, UA: NEGATIVE
Glucose, UA: NEGATIVE
Leukocytes, UA: NEGATIVE
NITRITE UA: NEGATIVE
PROTEIN UA: NEGATIVE
Spec Grav, UA: >=1.03 — AB (ref 1.010–1.025)
Urobilinogen, UA: 0.2 E.U./dL
pH, UA: 6.5 (ref 5.0–8.0)

## 2017-08-24 NOTE — Patient Instructions (Signed)
Finish course of antibiotics prescribed.  Advance diet to soft diet for a number of days and then slowly to regular diet.  Avoid alcohol while on Flagyl.  May take Imodium very sparingly if needed for diarrhea during sporting event.  Continue to monitor glucose.

## 2017-08-24 NOTE — Progress Notes (Signed)
   Subjective:    Patient ID: Natalie [name], female    DOB: 08/30/1946, 71 y.o.   MRN: [id number]  HPI Patient has a history of diabetes mellitus treated with Januvia with excellent hemoglobin A1c done in August.  She follows a strict diet.  Recently diagnosed with acute sigmoid diverticulitis placed on Cipro and Flagyl.  She also had urine culture which we now have results back showing she had a UTI as well with Proteus mirabilis sensitive to Cipro which she is on.  She has been having issues with diarrhea.  She called about that.  She is been on a clear liquid diet for diverticulitis.  We told her she could take Imodium.  However of course this slowed down her GI tract.  She felt a bit bloated.  She had diarrhea last night in the middle of the night and feels better this morning.  Her pain has improved.  She wants to go out of town to a ball game at [employer]  Will be returning here Friday.  Is anxious about all of this and is tearful in the office today.  Worries a great deal about her health.    Review of Systems see above.  She had colonoscopy in 2012.  Findings were normal except for diverticulosis and internal and external hemorrhoids.  This was done at [facility]     Objective:   Physical Exam Abdomen is soft nondistended without hepatosplenomegaly masses or tenderness.  Previously had been tender in the lower abdomen.  There is no rebound tenderness at all today.  Previous dipstick UA on October 19 had small amount of occult blood.  Dipstick UA is normal.       Assessment & Plan:  Non-complicated sigmoid diverticulitis  Proteus mirabilis UTI  Plan: She is to advance diet slowly.  May now be on soft diet for several days and advance slowly to regular diet.  Went over many foods that would be consistent with soft diet with her today.  Try to avoid Imodium.  May need it for travel tomorrow.  Avoid alcohol while on Flagyl.  May advance to regular diet if  continues to improve around November 5.  30 minutes spent with patient answering questions and examining her.

## 2017-08-29 ENCOUNTER — Ambulatory Visit (INDEPENDENT_AMBULATORY_CARE_PROVIDER_SITE_OTHER): Payer: PPO | Admitting: Internal Medicine

## 2017-08-29 ENCOUNTER — Encounter: Payer: Self-pay | Admitting: Internal Medicine

## 2017-08-29 VITALS — BP 88/50 | HR 66 | Temp 98.4°F | Wt 142.0 lb

## 2017-08-29 DIAGNOSIS — R42 Dizziness and giddiness: Secondary | ICD-10-CM | POA: Diagnosis not present

## 2017-08-29 DIAGNOSIS — N390 Urinary tract infection, site not specified: Secondary | ICD-10-CM

## 2017-08-29 DIAGNOSIS — E119 Type 2 diabetes mellitus without complications: Secondary | ICD-10-CM

## 2017-08-29 DIAGNOSIS — K5732 Diverticulitis of large intestine without perforation or abscess without bleeding: Secondary | ICD-10-CM

## 2017-08-29 DIAGNOSIS — E869 Volume depletion, unspecified: Secondary | ICD-10-CM

## 2017-08-29 LAB — CBC WITH DIFFERENTIAL/PLATELET
BASOS ABS: 62 {cells}/uL (ref 0–200)
Basophils Relative: 0.9 %
EOS ABS: 152 {cells}/uL (ref 15–500)
Eosinophils Relative: 2.2 %
HCT: 38.2 % (ref 35.0–45.0)
HEMOGLOBIN: 13.1 g/dL (ref 11.7–15.5)
Lymphs Abs: 1891 cells/uL (ref 850–3900)
MCH: 29.2 pg (ref 27.0–33.0)
MCHC: 34.3 g/dL (ref 32.0–36.0)
MCV: 85.1 fL (ref 80.0–100.0)
MONOS PCT: 10 %
MPV: 10.9 fL (ref 7.5–12.5)
NEUTROS ABS: 4106 {cells}/uL (ref 1500–7800)
NEUTROS PCT: 59.5 %
Platelets: 210 10*3/uL (ref 140–400)
RBC: 4.49 10*6/uL (ref 3.80–5.10)
RDW: 13.1 % (ref 11.0–15.0)
Total Lymphocyte: 27.4 %
WBC mixed population: 690 cells/uL (ref 200–950)
WBC: 6.9 10*3/uL (ref 3.8–10.8)

## 2017-08-29 LAB — COMPLETE METABOLIC PANEL WITH GFR
AG Ratio: 1.6 (calc) (ref 1.0–2.5)
ALBUMIN MSPROF: 4 g/dL (ref 3.6–5.1)
ALKALINE PHOSPHATASE (APISO): 46 U/L (ref 33–130)
ALT: 30 U/L — ABNORMAL HIGH (ref 6–29)
AST: 29 U/L (ref 10–35)
BILIRUBIN TOTAL: 0.4 mg/dL (ref 0.2–1.2)
BUN: 12 mg/dL (ref 7–25)
CHLORIDE: 109 mmol/L (ref 98–110)
CO2: 22 mmol/L (ref 20–32)
Calcium: 9.4 mg/dL (ref 8.6–10.4)
Creat: 0.71 mg/dL (ref 0.50–0.99)
GFR, Est African American: 101 mL/min/{1.73_m2} (ref >=60)
GFR, Est Non African American: 87 mL/min/{1.73_m2} (ref >=60)
GLOBULIN: 2.5 g/dL (ref 1.9–3.7)
GLUCOSE: 93 mg/dL (ref 65–99)
Potassium: 3.7 mmol/L (ref 3.5–5.3)
SODIUM: 141 mmol/L (ref 135–146)
Total Protein: 6.5 g/dL (ref 6.1–8.1)

## 2017-08-29 NOTE — Progress Notes (Signed)
   Subjective:    Patient ID: Natalie [name], female    DOB: 05-Aug-1946, 71 y.o.   MRN: [id number]  HPI Called today complaining of extreme dizziness over the last couple of days.  Seemed to be positional when she stood up to go to the bathroom.  She needed assistance.  Has been on Cipro and Flagyl for acute diverticulitis and a urinary tract infection graft Cipro could possibly cause dizziness.  Spoke with her husband.  He does not feel that she is eating and drinking she is on a soft diet at the present time and tolerating that fine.  She is afraid diarrhea will occur.  It started with the antibiotics and slowed down when she took some Imodium.  They were out of town for a couple of days.  She did fairly well out of town.  She is drinking Therapist, music and hot tea.  She has a history of diabetes and does not want her glucose to get out of hand.  She keeps a close eye on that.  No vomiting.  No diaphoresis.  No fever.    Review of Systems see above.  No dysuria.  No urinary frequency.     Objective:   Physical Exam Skin warm and dry.  Chest clear to auscultation.  Cardiac exam regular rate and rhythm.  Blood pressure standing is 90 over palpable and lying is 90 over palpable.  Abdomen no hepatosplenomegaly masses or significant tenderness.  Labs drawn and pending including a CBC with differential and complete metabolic panel       Assessment & Plan:  Dizziness-etiology unclear.  May be due to volume depletion or perhaps Cipro or some combination of the 2.  She has finished her course of Cipro.  Anxiety about illness  Encouraged increased p.o. intake and increase amount of food she is eating.  Continue with soft diet until the end of the week and then may eat regular food.

## 2017-08-29 NOTE — Patient Instructions (Signed)
Increase fluid consumption and diet.  Call if not better in 24-48 hours.  Labs drawn and pending.  Diverticulitis markedly improved.

## 2017-09-16 ENCOUNTER — Telehealth: Payer: Self-pay

## 2017-09-16 NOTE — Telephone Encounter (Signed)
Called pt per Dr.Baxleys request to see if she would be willing to try a Statin medication because of her diagnosis of Diabetes. Pt declined

## 2017-09-19 ENCOUNTER — Ambulatory Visit
Admission: RE | Admit: 2017-09-19 | Discharge: 2017-09-19 | Disposition: A | Discharge status: Home / Self Care | Payer: PPO | Source: Ambulatory Visit | Attending: Internal Medicine | Admitting: Internal Medicine

## 2017-09-19 DIAGNOSIS — Z1231 Encounter for screening mammogram for malignant neoplasm of breast: Secondary | ICD-10-CM | POA: Diagnosis not present

## 2017-09-21 ENCOUNTER — Other Ambulatory Visit: Payer: Self-pay | Admitting: Internal Medicine

## 2017-09-21 ENCOUNTER — Ambulatory Visit
Admission: RE | Admit: 2017-09-21 | Discharge: 2017-09-21 | Disposition: A | Discharge status: Home / Self Care | Payer: PPO | Source: Ambulatory Visit | Attending: Internal Medicine | Admitting: Internal Medicine

## 2017-09-21 DIAGNOSIS — N6489 Other specified disorders of breast: Secondary | ICD-10-CM

## 2017-09-21 DIAGNOSIS — N651 Disproportion of reconstructed breast: Secondary | ICD-10-CM | POA: Diagnosis not present

## 2017-09-21 DIAGNOSIS — R922 Inconclusive mammogram: Secondary | ICD-10-CM | POA: Diagnosis not present

## 2017-09-21 DIAGNOSIS — R928 Other abnormal and inconclusive findings on diagnostic imaging of breast: Secondary | ICD-10-CM

## 2017-09-27 DIAGNOSIS — L814 Other melanin hyperpigmentation: Secondary | ICD-10-CM | POA: Diagnosis not present

## 2017-09-27 DIAGNOSIS — L579 Skin changes due to chronic exposure to nonionizing radiation, unspecified: Secondary | ICD-10-CM | POA: Diagnosis not present

## 2017-09-27 DIAGNOSIS — L821 Other seborrheic keratosis: Secondary | ICD-10-CM | POA: Diagnosis not present

## 2017-09-27 DIAGNOSIS — D1801 Hemangioma of skin and subcutaneous tissue: Secondary | ICD-10-CM | POA: Diagnosis not present

## 2017-11-30 ENCOUNTER — Other Ambulatory Visit: Payer: Self-pay

## 2017-11-30 DIAGNOSIS — E119 Type 2 diabetes mellitus without complications: Secondary | ICD-10-CM

## 2017-12-13 ENCOUNTER — Ambulatory Visit (INDEPENDENT_AMBULATORY_CARE_PROVIDER_SITE_OTHER): Payer: PPO | Admitting: Internal Medicine

## 2017-12-13 ENCOUNTER — Encounter: Payer: Self-pay | Admitting: Internal Medicine

## 2017-12-13 ENCOUNTER — Telehealth: Payer: Self-pay | Admitting: Internal Medicine

## 2017-12-13 VITALS — BP 100/70 | HR 90 | Temp 101.3°F | Wt 136.0 lb

## 2017-12-13 DIAGNOSIS — J029 Acute pharyngitis, unspecified: Secondary | ICD-10-CM | POA: Diagnosis not present

## 2017-12-13 DIAGNOSIS — B349 Viral infection, unspecified: Secondary | ICD-10-CM

## 2017-12-13 DIAGNOSIS — R509 Fever, unspecified: Secondary | ICD-10-CM

## 2017-12-13 LAB — INFLUENZA A AND B AG, IMMUNOASSAY
INFLUENZA A ANTIGEN: NOT DETECTED
INFLUENZA B ANTIGEN: NOT DETECTED
MICRO NUMBER:: 90186596
SPECIMEN QUALITY: ADEQUATE

## 2017-12-13 MED ORDER — BENZONATATE 100 MG PO CAPS: 100.0000 mg | ORAL_CAPSULE | Freq: Three times a day (TID) | ORAL | 1 refills | Status: DC

## 2017-12-13 MED ORDER — AZITHROMYCIN 250 MG PO TABS: ORAL_TABLET | ORAL | 0 refills | Status: DC

## 2017-12-13 NOTE — Progress Notes (Signed)
   Subjective:    Patient ID: Natalie [name], female    DOB: July 15, 1946, 72 y.o.   MRN: [id number]  HPI 72 year old Female recently traveled to [place] to visit relatives.  While there, her husband came down with a respiratory infection.  We called in antibiotics for him.  She now has developed fever malaise and fatigue.  She took flu vaccine in August.      Review of Systems she has sore throat and ear pain.     Objective:   Physical Exam Temp 101.3 degrees pharynx slightly injected.  TMs clear.  Neck is supple without adenopathy.  Chest clear.  She looks fatigued.       Assessment & Plan:  Febrile respiratory illness-?  Influenza  Plan: Rapid flu A and B  assay sent to [place] lab.  Patient will take Zithromax Z-Pak 2 p.o. day 1 followed by 1 p.o. days 2 through 5.  She will take Tessalon Perles 100 mg 3 times daily as needed for cough.  Rest and drink plenty of fluids.  She has a history of diabetes mellitus treated with Januvia 50 mg daily.  Tylenol for fever.

## 2017-12-13 NOTE — Telephone Encounter (Signed)
Patient called stating that she had a rough night last night and woke up with a sore throat and is not feeling well. She would like to know if you would be able to see her today. Her husband is scheduled to see you today at 11:45am for a follow up.

## 2017-12-13 NOTE — Telephone Encounter (Signed)
Appointment scheduled.

## 2017-12-13 NOTE — Telephone Encounter (Signed)
Work in with husband

## 2017-12-25 ENCOUNTER — Encounter: Payer: Self-pay | Admitting: Internal Medicine

## 2017-12-25 NOTE — Patient Instructions (Addendum)
Take Tessalon Perles and Zithromax Z-Pak as directed.  Rest and drink plenty of fluids.  We will notify you with results of influenza assay.  Tylenol for fever.  Addendum: Rapid assay for influenza A and influenza B is negative

## 2017-12-27 ENCOUNTER — Other Ambulatory Visit: Payer: PPO | Admitting: Internal Medicine

## 2017-12-27 DIAGNOSIS — E119 Type 2 diabetes mellitus without complications: Secondary | ICD-10-CM

## 2017-12-28 LAB — HEMOGLOBIN A1C
EAG (MMOL/L): 6.2 (calc)
HEMOGLOBIN A1C: 5.5 %{Hb} (ref <5.7)
Mean Plasma Glucose: 111 (calc)

## 2017-12-28 LAB — MICROALBUMIN / CREATININE URINE RATIO
Creatinine, Urine: 168 mg/dL (ref 20–275)
MICROALB UR: 1.6 mg/dL
MICROALB/CREAT RATIO: 10 ug/mg{creat} (ref <30)

## 2017-12-30 ENCOUNTER — Ambulatory Visit (INDEPENDENT_AMBULATORY_CARE_PROVIDER_SITE_OTHER): Payer: PPO | Admitting: Internal Medicine

## 2017-12-30 ENCOUNTER — Encounter: Payer: Self-pay | Admitting: Internal Medicine

## 2017-12-30 VITALS — BP 100/70 | HR 66 | Wt 149.1 lb

## 2017-12-30 DIAGNOSIS — H6122 Impacted cerumen, left ear: Secondary | ICD-10-CM | POA: Diagnosis not present

## 2017-12-30 DIAGNOSIS — J069 Acute upper respiratory infection, unspecified: Secondary | ICD-10-CM | POA: Diagnosis not present

## 2017-12-30 DIAGNOSIS — R7302 Impaired glucose tolerance (oral): Secondary | ICD-10-CM

## 2017-12-30 MED ORDER — AZITHROMYCIN 250 MG PO TABS
ORAL_TABLET | ORAL | 0 refills | Status: DC
Start: 2017-12-30 — End: 2018-07-04

## 2017-12-30 NOTE — Patient Instructions (Addendum)
Wax removed from left ear with curette.  Take another Z-Pak-2 p.o. day 1 followed by 1 p.o. days 2 through 5.  Call if not better next week.  Continue diet and exercise for impaired glucose tolerance.

## 2017-12-30 NOTE — Progress Notes (Signed)
   Subjective:    Patient ID: Natalie [name], female    DOB: [birth year], 72 y.o.   MRN: [id number]  HPI Here today to follow-up on impaired glucose tolerance/diabetes mellitus.  She is no longer on Januvia and she is been able to keep her A1c within normal limits just with diet and exercise.  I am very pleased with that.  Recently seen here for respiratory infection.  She was given a Z-Pak but is not completely better.  Says her ears hurt and she has some persistent scratchy throat and some congestion.  She and her husband continue to travel quite a bit.  They will be going to [place] for the [facility] [place] basketball tournament in a couple of weeks.    Review of Systems see above     Objective:   Physical Exam Cerumen noted in left external ear canal removed with curette.  Right TM slightly full.  Left TM clear.  Pharynx is clear.  Neck supple.  Chest clear.       Assessment & Plan:  Protracted URI  Cerumen removed from left ear with curette  Impaired glucose tolerance-normal hemoglobin A1c with  Plan: Repeat Zithromax Z-PAK.  Call if not better in 1 week.  Physical exam due in August.

## 2018-03-28 ENCOUNTER — Ambulatory Visit
Admission: RE | Admit: 2018-03-28 | Discharge: 2018-03-28 | Disposition: A | Discharge status: Home / Self Care | Payer: PPO | Source: Ambulatory Visit | Attending: Internal Medicine | Admitting: Internal Medicine

## 2018-03-28 DIAGNOSIS — R922 Inconclusive mammogram: Secondary | ICD-10-CM | POA: Diagnosis not present

## 2018-03-28 DIAGNOSIS — N6489 Other specified disorders of breast: Secondary | ICD-10-CM

## 2018-06-26 ENCOUNTER — Other Ambulatory Visit: Payer: Self-pay | Admitting: Internal Medicine

## 2018-06-26 DIAGNOSIS — E7800 Pure hypercholesterolemia, unspecified: Secondary | ICD-10-CM

## 2018-06-26 DIAGNOSIS — Z1329 Encounter for screening for other suspected endocrine disorder: Secondary | ICD-10-CM

## 2018-06-26 DIAGNOSIS — E119 Type 2 diabetes mellitus without complications: Secondary | ICD-10-CM

## 2018-06-26 DIAGNOSIS — Z0000 Encounter for general adult medical examination without abnormal findings: Secondary | ICD-10-CM

## 2018-06-29 ENCOUNTER — Other Ambulatory Visit: Payer: PPO | Admitting: Internal Medicine

## 2018-06-29 DIAGNOSIS — E7800 Pure hypercholesterolemia, unspecified: Secondary | ICD-10-CM

## 2018-06-29 DIAGNOSIS — E119 Type 2 diabetes mellitus without complications: Secondary | ICD-10-CM

## 2018-06-29 DIAGNOSIS — Z1329 Encounter for screening for other suspected endocrine disorder: Secondary | ICD-10-CM

## 2018-06-29 DIAGNOSIS — Z0000 Encounter for general adult medical examination without abnormal findings: Secondary | ICD-10-CM | POA: Diagnosis not present

## 2018-06-30 LAB — LIPID PANEL
CHOL/HDL RATIO: 3 (calc) (ref <5.0)
CHOLESTEROL: 173 mg/dL (ref <200)
HDL: 58 mg/dL (ref >50)
LDL CHOLESTEROL (CALC): 96 mg/dL
Non-HDL Cholesterol (Calc): 115 mg/dL (calc) (ref <130)
Triglycerides: 92 mg/dL (ref <150)

## 2018-06-30 LAB — CBC WITH DIFFERENTIAL/PLATELET
Basophils Absolute: 69 cells/uL (ref 0–200)
Basophils Relative: 1.3 %
Eosinophils Absolute: 239 cells/uL (ref 15–500)
Eosinophils Relative: 4.5 %
HCT: 42.1 % (ref 35.0–45.0)
HEMOGLOBIN: 14 g/dL (ref 11.7–15.5)
Lymphs Abs: 1436 cells/uL (ref 850–3900)
MCH: 29.8 pg (ref 27.0–33.0)
MCHC: 33.3 g/dL (ref 32.0–36.0)
MCV: 89.6 fL (ref 80.0–100.0)
MONOS PCT: 10.6 %
MPV: 10.7 fL (ref 7.5–12.5)
NEUTROS ABS: 2995 {cells}/uL (ref 1500–7800)
Neutrophils Relative %: 56.5 %
PLATELETS: 184 10*3/uL (ref 140–400)
RBC: 4.7 10*6/uL (ref 3.80–5.10)
RDW: 13.5 % (ref 11.0–15.0)
Total Lymphocyte: 27.1 %
WBC mixed population: 562 cells/uL (ref 200–950)
WBC: 5.3 10*3/uL (ref 3.8–10.8)

## 2018-06-30 LAB — COMPLETE METABOLIC PANEL WITH GFR
AG RATIO: 1.6 (calc) (ref 1.0–2.5)
ALT: 16 U/L (ref 6–29)
AST: 20 U/L (ref 10–35)
Albumin: 4.3 g/dL (ref 3.6–5.1)
Alkaline phosphatase (APISO): 60 U/L (ref 33–130)
BILIRUBIN TOTAL: 0.6 mg/dL (ref 0.2–1.2)
BUN: 17 mg/dL (ref 7–25)
CALCIUM: 9.7 mg/dL (ref 8.6–10.4)
CHLORIDE: 109 mmol/L (ref 98–110)
CO2: 29 mmol/L (ref 20–32)
Creat: 0.73 mg/dL (ref 0.60–0.93)
GFR, EST AFRICAN AMERICAN: 97 mL/min/{1.73_m2} (ref >=60)
GFR, EST NON AFRICAN AMERICAN: 83 mL/min/{1.73_m2} (ref >=60)
GLOBULIN: 2.7 g/dL (ref 1.9–3.7)
Glucose, Bld: 91 mg/dL (ref 65–99)
POTASSIUM: 4.2 mmol/L (ref 3.5–5.3)
SODIUM: 142 mmol/L (ref 135–146)
TOTAL PROTEIN: 7 g/dL (ref 6.1–8.1)

## 2018-06-30 LAB — HEMOGLOBIN A1C
EAG (MMOL/L): 6.2 (calc)
Hgb A1c MFr Bld: 5.5 % of total Hgb (ref <5.7)
MEAN PLASMA GLUCOSE: 111 (calc)

## 2018-06-30 LAB — TSH: TSH: 1.93 mIU/L (ref 0.40–4.50)

## 2018-07-04 ENCOUNTER — Encounter: Payer: Self-pay | Admitting: Internal Medicine

## 2018-07-04 ENCOUNTER — Ambulatory Visit (INDEPENDENT_AMBULATORY_CARE_PROVIDER_SITE_OTHER): Payer: PPO | Admitting: Internal Medicine

## 2018-07-04 VITALS — BP 110/80 | HR 72 | Ht 65.0 in | Wt 142.0 lb

## 2018-07-04 DIAGNOSIS — R7302 Impaired glucose tolerance (oral): Secondary | ICD-10-CM | POA: Insufficient documentation

## 2018-07-04 DIAGNOSIS — Z23 Encounter for immunization: Secondary | ICD-10-CM | POA: Diagnosis not present

## 2018-07-04 DIAGNOSIS — N6012 Diffuse cystic mastopathy of left breast: Secondary | ICD-10-CM

## 2018-07-04 DIAGNOSIS — Z8659 Personal history of other mental and behavioral disorders: Secondary | ICD-10-CM

## 2018-07-04 DIAGNOSIS — Z0000 Encounter for general adult medical examination without abnormal findings: Secondary | ICD-10-CM | POA: Diagnosis not present

## 2018-07-04 LAB — POCT URINALYSIS DIPSTICK
APPEARANCE: NORMAL
Bilirubin, UA: NEGATIVE
Glucose, UA: NEGATIVE
Ketones, UA: NEGATIVE
LEUKOCYTES UA: NEGATIVE
Nitrite, UA: NEGATIVE
Odor: NORMAL
PH UA: 6 (ref 5.0–8.0)
Protein, UA: NEGATIVE
SPEC GRAV UA: 1.015 (ref 1.010–1.025)
UROBILINOGEN UA: 0.2 U/dL

## 2018-07-04 NOTE — Progress Notes (Deleted)
   Subjective:    Patient ID: Natalie [name], female    DOB: 07-09-1946, 72 y.o.   MRN: [id number]  HPI     Review of Systems     Objective:   Physical Exam        Assessment & Plan:

## 2018-07-04 NOTE — Progress Notes (Signed)
Subjective:    Patient ID: Natalie [name], female    DOB: 08-28-1946, 72 y.o.   MRN: [id number]  HPI 72 year old Female for health maintenance exam, Medicare wellness and evaluation of medical issues.  Had abnormal mammogram November 2018 with repeat that was stable in May. There is an area of asymmetry left upper outer quadrant that is being watched.  She has a history of diabetes mellitus treated with Januvia.  She is done well with her diabetic control.  Diabetes was diagnosed in July 2016 after presenting with urinary frequency.  Hemoglobin A1c at that time was 13.4% and is now 5.5%.  History of anxiety and hypertriglyceridemia.  Lipids are entirely normal.  Reminded about annual eye exam.  Past medical history: Anxiety is especially bothersome with airplane flight.  History of fractured foot secondary to a fall in 1986.  History of right shoulder adhesive capsulitis November 2010.  History of vaginal hysterectomy with enterocele and rectocele repair 2004.  Had colonoscopy in May 2012 showing hyperplastic polyps.  Was hospitalized in the spring 2012 after a bout of gastroenteritis resulting in a partial small bowel obstruction.  Was treated with IV fluids, bowel rest and subsequently obstruction resolved.  Social history: She is married.  Family operates the [employer].  She does not smoke.  Social alcohol consumption.  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  She has not worked outside the home since 1986.  2 adult children.  Husband has heart issues.  Family history: Father died at age 77 due to diffuse alveolar damage of the lung.  Mother died at age 29 of complications of pneumonia.  One sister in good health.    Review of Systems  Constitutional: Negative.   All other systems reviewed and are negative.      Objective:   Physical Exam  Constitutional: She is oriented to person, place, and time. She appears well-developed and well-nourished.  HENT:  Head: Normocephalic and  atraumatic.  Right Ear: External ear normal.  Left Ear: External ear normal.  Mouth/Throat: Oropharynx is clear and moist.  Eyes: Pupils are equal, round, and reactive to light. Conjunctivae and EOM are normal.  Neck: No JVD present. No thyromegaly present.  Cardiovascular: Normal rate, regular rhythm, normal heart sounds and intact distal pulses.  No murmur heard. Pulmonary/Chest: Effort normal and breath sounds normal. No stridor. No respiratory distress. She has no wheezes.  Abdominal: Soft. Bowel sounds are normal. She exhibits no distension and no mass. There is no tenderness. There is no rebound and no guarding.  Genitourinary:  Genitourinary Comments: Bimanual normal  Musculoskeletal: She exhibits no edema.  Neurological: She is alert and oriented to person, place, and time. She displays normal reflexes. No cranial nerve deficit. Coordination normal.  Skin: Skin is warm and dry.  Psychiatric: She has a normal mood and affect. Her behavior is normal. Judgment and thought content normal.  Vitals reviewed.         Assessment & Plan:  Normal health maintenance exam  Impaired glucose tolerance- now off Januvia. To have diabetic eye exam  Flu vaccine given  Abnormal left mammogram- being followed  RTC in 6 months or as needed.  Subjective:   Patient presents for Medicare Annual/Subsequent preventive examination.  Review Past Medical/Family/Social: See above   Risk Factors  Current exercise habits: Exercises regularly Dietary issues discussed: Low-fat low carbohydrate  Cardiac risk factors: History of diabetes  Depression Screen  (Note: if answer to either of the following  is "Yes", a more complete depression screening is indicated)   Over the past two weeks, have you felt down, depressed or hopeless? No  Over the past two weeks, have you felt little interest or pleasure in doing things? No Have you lost interest or pleasure in daily life? No Do you often feel  hopeless? No Do you cry easily over simple problems? No   Activities of Daily Living  In your present state of health, do you have any difficulty performing the following activities?:   Driving? No  Managing money? No  Feeding yourself? No  Getting from bed to chair? No  Climbing a flight of stairs? No  Preparing food and eating?: No  Bathing or showering? No  Getting dressed: No  Getting to the toilet? No  Using the toilet:No  Moving around from place to place: No  In the past year have you fallen or had a near fall?:No  Are you sexually active? yes Do you have more than one partner? No   Hearing Difficulties: No  Do you often ask people to speak up or repeat themselves? No  Do you experience ringing or noises in your ears? No  Do you have difficulty understanding soft or whispered voices? No  Do you feel that you have a problem with memory? No Do you often misplace items? No    Home Safety:  Do you have a smoke alarm at your residence? Yes Do you have grab bars in the bathroom?  No Do you have throw rugs in your house?  Yes   Cognitive Testing  Alert? Yes Normal Appearance?Yes  Oriented to person? Yes Place? Yes  Time? Yes  Recall of three objects? Yes  Can perform simple calculations? Yes  Displays appropriate judgment?Yes  Can read the correct time from a watch face?Yes   List the Names of Other Physician/Practitioners you currently use:  See referral list for the physicians patient is currently seeing.     Review of Systems: See above  Objective:     General appearance: Appears younger than stated age Head: Normocephalic, without obvious abnormality, atraumatic  Eyes: conj clear, EOMi PEERLA  Ears: normal TM's and external ear canals both ears  Nose: Nares normal. Septum midline. Mucosa normal. No drainage or sinus tenderness.  Throat: lips, mucosa, and tongue normal; teeth and gums normal  Neck: no adenopathy, no carotid bruit, no JVD, supple,  symmetrical, trachea midline and thyroid not enlarged, symmetric, no tenderness/mass/nodules  No CVA tenderness.  Lungs: clear to auscultation bilaterally  Breasts: normal appearance, no masses or tenderness Heart: regular rate and rhythm, S1, S2 normal, no murmur, click, rub or gallop  Abdomen: soft, non-tender; bowel sounds normal; no masses, no organomegaly  Musculoskeletal: ROM normal in all joints, no crepitus, no deformity, Normal muscle strengthen. Back  is symmetric, no curvature. Skin: Skin color, texture, turgor normal. No rashes or lesions  Lymph nodes: Cervical, supraclavicular, and axillary nodes normal.  Neurologic: CN 2 -12 Normal, Normal symmetric reflexes. Normal coordination and gait  Psych: Alert & Oriented x 3, Mood appear stable.    Assessment:    Annual wellness medicare exam   Plan:    During the course of the visit the patient was educated and counseled about appropriate screening and preventive services including:        Patient Instructions (the written plan) was given to the patient.  Medicare Attestation  I have personally reviewed:  The patient's medical and social history  Their use  of alcohol, tobacco or illicit drugs  Their current medications and supplements  The patient's functional ability including ADLs,fall risks, home safety risks, cognitive, and hearing and visual impairment  Diet and physical activities  Evidence for depression or mood disorders  The patient's weight, height, BMI, and visual acuity have been recorded in the chart. I have made referrals, counseling, and provided education to the patient based on review of the above and I have provided the patient with a written personalized care plan for preventive services.

## 2018-07-04 NOTE — Patient Instructions (Signed)
Continue to watch diet. Flu vaccine given RTC in 6 months. Mammogram to be done November and bone density ordered for November

## 2018-07-05 ENCOUNTER — Other Ambulatory Visit: Payer: Self-pay | Admitting: Internal Medicine

## 2018-07-05 DIAGNOSIS — E2839 Other primary ovarian failure: Secondary | ICD-10-CM

## 2018-07-05 DIAGNOSIS — R7302 Impaired glucose tolerance (oral): Secondary | ICD-10-CM

## 2018-07-05 DIAGNOSIS — Z1231 Encounter for screening mammogram for malignant neoplasm of breast: Secondary | ICD-10-CM

## 2018-09-15 ENCOUNTER — Ambulatory Visit (INDEPENDENT_AMBULATORY_CARE_PROVIDER_SITE_OTHER): Payer: PPO | Admitting: Internal Medicine

## 2018-09-15 ENCOUNTER — Encounter: Payer: Self-pay | Admitting: Internal Medicine

## 2018-09-15 VITALS — BP 140/88 | HR 62 | Temp 98.2°F | Ht 65.0 in | Wt 146.0 lb

## 2018-09-15 DIAGNOSIS — R55 Syncope and collapse: Secondary | ICD-10-CM

## 2018-09-15 DIAGNOSIS — J069 Acute upper respiratory infection, unspecified: Secondary | ICD-10-CM

## 2018-09-15 LAB — COMPLETE METABOLIC PANEL WITH GFR
AG RATIO: 1.5 (calc) (ref 1.0–2.5)
ALKALINE PHOSPHATASE (APISO): 52 U/L (ref 33–130)
ALT: 15 U/L (ref 6–29)
AST: 21 U/L (ref 10–35)
Albumin: 4.2 g/dL (ref 3.6–5.1)
BUN: 14 mg/dL (ref 7–25)
CO2: 28 mmol/L (ref 20–32)
Calcium: 9.6 mg/dL (ref 8.6–10.4)
Chloride: 106 mmol/L (ref 98–110)
Creat: 0.67 mg/dL (ref 0.60–0.93)
GFR, Est African American: 103 mL/min/{1.73_m2} (ref >=60)
GFR, Est Non African American: 89 mL/min/{1.73_m2} (ref >=60)
Globulin: 2.8 g/dL (calc) (ref 1.9–3.7)
Glucose, Bld: 108 mg/dL — ABNORMAL HIGH (ref 65–99)
POTASSIUM: 4.8 mmol/L (ref 3.5–5.3)
Sodium: 141 mmol/L (ref 135–146)
Total Bilirubin: 0.6 mg/dL (ref 0.2–1.2)
Total Protein: 7 g/dL (ref 6.1–8.1)

## 2018-09-15 LAB — POCT GLUCOSE (DEVICE FOR HOME USE): POC GLUCOSE: 133 mg/dL — AB (ref 70–99)

## 2018-09-15 LAB — CBC WITH DIFFERENTIAL/PLATELET
BASOS ABS: 57 {cells}/uL (ref 0–200)
BASOS PCT: 0.8 %
EOS ABS: 277 {cells}/uL (ref 15–500)
Eosinophils Relative: 3.9 %
HEMATOCRIT: 39.9 % (ref 35.0–45.0)
HEMOGLOBIN: 13.7 g/dL (ref 11.7–15.5)
LYMPHS ABS: 1832 {cells}/uL (ref 850–3900)
MCH: 29.8 pg (ref 27.0–33.0)
MCHC: 34.3 g/dL (ref 32.0–36.0)
MCV: 86.9 fL (ref 80.0–100.0)
MPV: 10.8 fL (ref 7.5–12.5)
Monocytes Relative: 9.1 %
NEUTROS ABS: 4288 {cells}/uL (ref 1500–7800)
Neutrophils Relative %: 60.4 %
Platelets: 188 10*3/uL (ref 140–400)
RBC: 4.59 10*6/uL (ref 3.80–5.10)
RDW: 12.9 % (ref 11.0–15.0)
Total Lymphocyte: 25.8 %
WBC mixed population: 646 cells/uL (ref 200–950)
WBC: 7.1 10*3/uL (ref 3.8–10.8)

## 2018-09-15 LAB — POCT URINALYSIS DIPSTICK
Appearance: NORMAL
BILIRUBIN UA: NEGATIVE
Blood, UA: NEGATIVE
GLUCOSE UA: NEGATIVE
KETONES UA: NEGATIVE
LEUKOCYTES UA: NEGATIVE
Nitrite, UA: NEGATIVE
Odor: NORMAL
Protein, UA: POSITIVE — AB
Spec Grav, UA: 1.01 (ref 1.010–1.025)
UROBILINOGEN UA: 0.2 U/dL
pH, UA: 6.5 (ref 5.0–8.0)

## 2018-09-15 MED ORDER — AZITHROMYCIN 250 MG PO TABS: ORAL_TABLET | ORAL | 0 refills | Status: DC

## 2018-09-15 MED ORDER — BENZONATATE 100 MG PO CAPS: 100.0000 mg | ORAL_CAPSULE | Freq: Three times a day (TID) | ORAL | 1 refills | Status: DC | PRN

## 2018-09-15 NOTE — Patient Instructions (Signed)
Tessalon Perles 100 mg 3 times a day as needed for cough.  Take Zithromax Z-PAK 2 tablets day 1 followed by 1 tablet days 2 through 5.  Rest and drink plenty of fluids.  Labs pending.

## 2018-09-15 NOTE — Progress Notes (Signed)
   Subjective:    Patient ID: Natalie [name], female    DOB: [birth year], 72 y.o.   MRN: [id number]  HPI 72 year old Female with remote history of DM brought in today by her husband.  Patient and her husband have been visiting a winery.  She felt fine when she left home.  They had to stand in line for some 30 minutes to be served.  She drinks very little alcohol at all.  When she came out of the serving areas she felt as if she might blackout.  She sat down outside with her husband and apparently did have a brief period of syncope.  When she came to she knew who she was and where she was.  She had no vomiting.  No seizure activity at all.  This was frightening to her and to her husband.  They subsequently left the winery and drove here.  She does not want to go to the emergency department for evaluation.  In August, Hgb AIC was 5.5%  Review of Systems denies chest pain shortness of breath or palpitations.  No headache.  No visual disturbance.  Is a little anxious but feels basically okay now.  Apparently recently developed URI symptoms but no significant sore throat just some stuffy nose.    Objective:   Physical Exam Accu-Chek here in the office is 133.  Urine dipstick is positive for protein otherwise negative with specific gravity 1.010.  CBC shows normal hemoglobin of 13.7 g and normal white blood cell count of 7100.  BUN and creatinine are normal.  Sodium and potassium are normal as well as liver functions. Skin is warm and dry.  Nodes none.  Neck is supple without JVD thyromegaly or carotid bruits.  Chest is clear to auscultation.  Cardiac exam regular rate and rhythm normal S1 and S2 without murmurs or ectopy.  She is alert and oriented x3 with no gross focal deficits on brief neurological exam.  No lower extremity edema.      Assessment & Plan:  Probable vasovagal syncope  URI-take Zithromax Z-PAK as directed and Tessalon Perles as needed for cough.  Plan: Husband will keep close eye  on her this afternoon.  She will try to hydrate herself well.  Call if symptoms recur or any concerns.

## 2018-09-20 ENCOUNTER — Ambulatory Visit
Admission: RE | Admit: 2018-09-20 | Discharge: 2018-09-20 | Disposition: A | Discharge status: Home / Self Care | Payer: PPO | Source: Ambulatory Visit | Attending: Internal Medicine | Admitting: Internal Medicine

## 2018-09-20 DIAGNOSIS — Z780 Asymptomatic menopausal state: Secondary | ICD-10-CM | POA: Diagnosis not present

## 2018-09-20 DIAGNOSIS — Z1231 Encounter for screening mammogram for malignant neoplasm of breast: Secondary | ICD-10-CM

## 2018-09-20 DIAGNOSIS — M8588 Other specified disorders of bone density and structure, other site: Secondary | ICD-10-CM | POA: Diagnosis not present

## 2018-09-20 DIAGNOSIS — M810 Age-related osteoporosis without current pathological fracture: Secondary | ICD-10-CM | POA: Diagnosis not present

## 2018-10-03 ENCOUNTER — Ambulatory Visit (INDEPENDENT_AMBULATORY_CARE_PROVIDER_SITE_OTHER): Payer: PPO | Admitting: Internal Medicine

## 2018-10-03 ENCOUNTER — Encounter: Payer: Self-pay | Admitting: Internal Medicine

## 2018-10-03 VITALS — BP 120/80 | HR 80 | Ht 65.0 in | Wt 146.0 lb

## 2018-10-03 DIAGNOSIS — M818 Other osteoporosis without current pathological fracture: Secondary | ICD-10-CM

## 2018-10-05 DIAGNOSIS — H04123 Dry eye syndrome of bilateral lacrimal glands: Secondary | ICD-10-CM | POA: Diagnosis not present

## 2018-10-05 DIAGNOSIS — H524 Presbyopia: Secondary | ICD-10-CM | POA: Diagnosis not present

## 2018-10-05 DIAGNOSIS — H52203 Unspecified astigmatism, bilateral: Secondary | ICD-10-CM | POA: Diagnosis not present

## 2018-10-05 DIAGNOSIS — H25813 Combined forms of age-related cataract, bilateral: Secondary | ICD-10-CM | POA: Diagnosis not present

## 2018-10-05 DIAGNOSIS — H5203 Hypermetropia, bilateral: Secondary | ICD-10-CM | POA: Diagnosis not present

## 2018-10-12 ENCOUNTER — Encounter: Payer: Self-pay | Admitting: Internal Medicine

## 2018-10-12 ENCOUNTER — Telehealth: Payer: Self-pay

## 2018-10-12 ENCOUNTER — Ambulatory Visit (INDEPENDENT_AMBULATORY_CARE_PROVIDER_SITE_OTHER): Payer: PPO | Admitting: Internal Medicine

## 2018-10-12 VITALS — BP 110/70 | HR 63 | Temp 98.3°F | Ht 65.0 in | Wt 143.0 lb

## 2018-10-12 DIAGNOSIS — N390 Urinary tract infection, site not specified: Secondary | ICD-10-CM | POA: Diagnosis not present

## 2018-10-12 DIAGNOSIS — R8290 Unspecified abnormal findings in urine: Secondary | ICD-10-CM | POA: Diagnosis not present

## 2018-10-12 DIAGNOSIS — R1030 Lower abdominal pain, unspecified: Secondary | ICD-10-CM | POA: Diagnosis not present

## 2018-10-12 LAB — POCT URINALYSIS DIPSTICK
APPEARANCE: NEGATIVE
BILIRUBIN UA: NEGATIVE
GLUCOSE UA: NEGATIVE
KETONES UA: NEGATIVE
Nitrite, UA: NEGATIVE
ODOR: NEGATIVE
Protein, UA: NEGATIVE
SPEC GRAV UA: 1.015 (ref 1.010–1.025)
Urobilinogen, UA: 0.2 E.U./dL
pH, UA: 6 (ref 5.0–8.0)

## 2018-10-12 LAB — CBC WITH DIFFERENTIAL/PLATELET
BASOS ABS: 53 {cells}/uL (ref 0–200)
Basophils Relative: 0.7 %
EOS ABS: 304 {cells}/uL (ref 15–500)
Eosinophils Relative: 4 %
HCT: 39.4 % (ref 35.0–45.0)
HEMOGLOBIN: 12.9 g/dL (ref 11.7–15.5)
LYMPHS ABS: 2189 {cells}/uL (ref 850–3900)
MCH: 28.4 pg (ref 27.0–33.0)
MCHC: 32.7 g/dL (ref 32.0–36.0)
MCV: 86.8 fL (ref 80.0–100.0)
MPV: 11.2 fL (ref 7.5–12.5)
Monocytes Relative: 8.4 %
NEUTROS PCT: 58.1 %
Neutro Abs: 4416 cells/uL (ref 1500–7800)
Platelets: 205 10*3/uL (ref 140–400)
RBC: 4.54 10*6/uL (ref 3.80–5.10)
RDW: 12.7 % (ref 11.0–15.0)
Total Lymphocyte: 28.8 %
WBC: 7.6 10*3/uL (ref 3.8–10.8)
WBCMIX: 638 {cells}/uL (ref 200–950)

## 2018-10-12 MED ORDER — CIPROFLOXACIN HCL 500 MG PO TABS: 500.0000 mg | ORAL_TABLET | Freq: Two times a day (BID) | ORAL | 0 refills | Status: DC

## 2018-10-12 NOTE — Patient Instructions (Signed)
Cipro 500 mg twice daily for 10 days.  Urine culture pending.  Stay with soft foods a day or 2 and advance diet slowly.  Call if symptoms worsen or do not improve.

## 2018-10-12 NOTE — Progress Notes (Signed)
Acute Office Visit  Subjective:    Patient ID: Natalie [name], female    DOB: Nov 25, 1945, 72 y.o.   MRN: [id number]  Chief Complaint  Patient presents with  . Abdominal Pain    HPI Patient is in today for lower abdominal pain onset yesterday.  Has been eating some [place] food dishes but not terribly spicy foods.  No nausea or vomiting.  No fever or shaking chills.  Thinks it is a bit like the bout of diverticulitis she had last year.  Denies urinary frequency or dysuria.  Past Medical History:  Diagnosis Date  . Allergic rhinitis   . Diabetes mellitus without complication [facility])     Past Surgical History:  Procedure Laterality Date  . ABDOMINAL HYSTERECTOMY     for dysfunctional bleeding 2002, Dr.Lomax    Family History  Problem Relation Age of Onset  . Lung disease Father        diffuse alveolar, following lung injury in MVA  . Cancer Other   . Breast cancer Maternal Grandmother     Social History   Socioeconomic History  . Marital status: Married    Spouse name: Not on file  . Number of children: Not on file  . Years of education: Not on file  . Highest education level: Not on file  Occupational History  . Not on file  Social Needs  . Financial resource strain: Not on file  . Food insecurity:    Worry: Not on file    Inability: Not on file  . Transportation needs:    Medical: Not on file    Non-medical: Not on file  Tobacco Use  . Smoking status: Never Smoker  . Smokeless tobacco: Never Used  Substance and Sexual Activity  . Alcohol use: Yes    Comment: socially  . Drug use: No  . Sexual activity: Yes    Birth control/protection: Post-menopausal  Lifestyle  . Physical activity:    Days per week: Not on file    Minutes per session: Not on file  . Stress: Not on file  Relationships  . Social connections:    Talks on phone: Not on file    Gets together: Not on file    Attends religious service: Not on file    Active member of club or  organization: Not on file    Attends meetings of clubs or organizations: Not on file    Relationship status: Not on file  . Intimate partner violence:    Fear of current or ex partner: Not on file    Emotionally abused: Not on file    Physically abused: Not on file    Forced sexual activity: Not on file  Other Topics Concern  . Not on file  Social History Narrative   Regular exercise- no     Outpatient Medications Prior to Visit  Medication Sig Dispense Refill  . calcium citrate-vitamin D (CITRACAL+D) 315-200 MG-UNIT per tablet Take 1 tablet by mouth daily.      . Cholecalciferol (VITAMIN D3) 1000 UNITS CAPS Take by mouth daily.       No facility-administered medications prior to visit.     No Known Allergies  ROS no nausea or vomiting     Objective:    Physical Exam  BP 110/70   Pulse 63   Temp 98.3 F (36.8 C)   Ht 5\' 5"  (1.651 m)   Wt 143 lb (64.9 kg)   SpO2 99%   BMI 23.80  kg/m  Wt Readings from Last 3 Encounters:  10/12/18 143 lb (64.9 kg)  10/03/18 146 lb (66.2 kg)  09/15/18 146 lb (66.2 kg)    Health Maintenance Due  Topic Date Due  . OPHTHALMOLOGY EXAM  06/03/1958    There are no preventive care reminders to display for this patient.   Lab Results  Component Value Date   TSH 1.93 06/29/2018   Lab Results  Component Value Date   WBC 7.1 09/15/2018   HGB 13.7 09/15/2018   HCT 39.9 09/15/2018   MCV 86.9 09/15/2018   PLT 188 09/15/2018   Lab Results  Component Value Date   NA 141 09/15/2018   K 4.8 09/15/2018   CO2 28 09/15/2018   GLUCOSE 108 (H) 09/15/2018   BUN 14 09/15/2018   CREATININE 0.67 09/15/2018   BILITOT 0.6 09/15/2018   ALKPHOS 48 06/27/2017   AST 21 09/15/2018   ALT 15 09/15/2018   PROT 7.0 09/15/2018   ALBUMIN 4.5 06/27/2017   CALCIUM 9.6 09/15/2018   Lab Results  Component Value Date   CHOL 173 06/29/2018   Lab Results  Component Value Date   HDL 58 06/29/2018   Lab Results  Component Value Date   LDLCALC  96 06/29/2018   Lab Results  Component Value Date   TRIG 92 06/29/2018   Lab Results  Component Value Date   CHOLHDL 3.0 06/29/2018   Lab Results  Component Value Date   HGBA1C 5.5 06/29/2018       Assessment & Plan:   Problem List Items Addressed This Visit    None    Visit Diagnoses    Lower abdominal pain    -  Primary   Relevant Orders   POCT urinalysis dipstick (Completed)   CBC with Differential/Platelet   Abnormal urinalysis       Relevant Orders   Urine Culture   CBC with Differential/Platelet       Meds ordered this encounter  Medications  . ciprofloxacin (CIPRO) 500 MG tablet    Sig: Take 1 tablet (500 mg total) by mouth 2 (two) times daily.    Dispense:  20 tablet    Refill:  0   Soft foods and advance diet slowly.  CBC with differential drawn.  Urine culture pending.  Elby Showers, MD

## 2018-10-12 NOTE — Telephone Encounter (Signed)
Patient called states she is having the same symptoms of diverticulitis as she had last year, the only symptom she is having is "her stomach is sore". She wants to know if she can come see you.

## 2018-10-12 NOTE — Telephone Encounter (Signed)
Have her come at 2:15pm

## 2018-10-12 NOTE — Progress Notes (Signed)
   Subjective:    Patient ID: Natalie [name], female    DOB: June 05, 1946, 72 y.o.   MRN: [id number]  HPI    Review of Systems     Objective:   Physical Exam        Assessment & Plan:

## 2018-10-13 LAB — URINE CULTURE
MICRO NUMBER: 91489734
SPECIMEN QUALITY:: ADEQUATE

## 2018-10-16 DIAGNOSIS — D225 Melanocytic nevi of trunk: Secondary | ICD-10-CM | POA: Diagnosis not present

## 2018-10-16 DIAGNOSIS — D1801 Hemangioma of skin and subcutaneous tissue: Secondary | ICD-10-CM | POA: Diagnosis not present

## 2018-10-16 DIAGNOSIS — L821 Other seborrheic keratosis: Secondary | ICD-10-CM | POA: Diagnosis not present

## 2018-10-16 DIAGNOSIS — L814 Other melanin hyperpigmentation: Secondary | ICD-10-CM | POA: Diagnosis not present

## 2018-10-16 DIAGNOSIS — L579 Skin changes due to chronic exposure to nonionizing radiation, unspecified: Secondary | ICD-10-CM | POA: Diagnosis not present

## 2018-10-28 NOTE — Patient Instructions (Signed)
Patient wants to defer taking medication for osteoporosis at this point in time.

## 2018-10-28 NOTE — Progress Notes (Addendum)
   Subjective:    Patient ID: Natalie [name], female    DOB: 12-17-1945, 72 y.o.   MRN: [id number]  HPI I asked this 72 year old Female with osteoporosis to come in today to discuss bone density results.  She says she has a prior history of osteoporosis and in the remote past took a bisphosphonate medication orally.  Discussed possibility of Prolia therapy with her and she really does not want to consider that at this time.  We have not checked her vitamin D level recently but we probably should do so in the near future.  Records in her old chart from Dr. Ubaldo Glassing indicate that her DEXA scan revealed osteopenia bordering on osteoporosis at time of exam in March 2004 but no copy of report is in these records that I have.  Her bone density study was done November 20 the same time as her mammogram.  T score in the right femoral neck was -3.1, in the AP spine it was -1.7.  She is fearful of taking medication for osteoporosis.  She does take calcium and vitamin D supplement.      Review of Systems see above     Objective:   Physical Exam  She was not examined today.  We had a 15-minute discussion.  She really wants to defer taking medication for osteoporosis at the present time      Assessment & Plan:  Suggested we reevaluate bone density study in 1 to 2 years since she is uncomfortable with taking medication for osteoporosis at this point in time.

## 2018-10-30 ENCOUNTER — Other Ambulatory Visit: Payer: Self-pay

## 2018-10-30 DIAGNOSIS — J069 Acute upper respiratory infection, unspecified: Secondary | ICD-10-CM

## 2018-10-30 MED ORDER — AZITHROMYCIN 250 MG PO TABS: ORAL_TABLET | ORAL | 0 refills | Status: DC

## 2018-10-30 NOTE — Telephone Encounter (Signed)
Patient called in with a sore throat that started this morning and wanted something called in. Sent in zpak per Dr. Renold Genta.

## 2018-11-06 ENCOUNTER — Ambulatory Visit (INDEPENDENT_AMBULATORY_CARE_PROVIDER_SITE_OTHER): Payer: PPO | Admitting: Internal Medicine

## 2018-11-06 VITALS — BP 100/60 | HR 83 | Temp 99.4°F | Ht 65.0 in | Wt 144.0 lb

## 2018-11-06 DIAGNOSIS — F411 Generalized anxiety disorder: Secondary | ICD-10-CM

## 2018-11-06 DIAGNOSIS — H6501 Acute serous otitis media, right ear: Secondary | ICD-10-CM | POA: Diagnosis not present

## 2018-11-06 DIAGNOSIS — J069 Acute upper respiratory infection, unspecified: Secondary | ICD-10-CM

## 2018-11-06 MED ORDER — AMOXICILLIN 500 MG PO CAPS: 500.0000 mg | ORAL_CAPSULE | Freq: Three times a day (TID) | ORAL | 0 refills | Status: DC

## 2018-11-06 NOTE — Progress Notes (Signed)
   Subjective:    Patient ID: Natalie [name], female    DOB: 04-Dec-1945, 73 y.o.   MRN: [id number]  HPI 73 year old Female with sore throat and ear pain. No fever or chills but has malaise and is anxious.Travels a lot.Just finished Z pak which was started on Dec 30th.Still symptomatic and now ear is hurting.    Review of Systems see Olga Millers has a history of impaired glucose tolerance but it is well controlled     Objective:   Physical Exam T.99.4 Pulse ox 97% pharynx slightly injected.  Right TM is slightly full but not really red.  Neck is supple without adenopathy.  Chest clear to auscultation.       Assessment & Plan:  Persistent upper respiratory infection  Right serous otitis media  History of impaired glucose tolerance  Anxiety state  Plan: Amoxicillin 500 mg 3 times a day for 10 days.  Rest and drink plenty of fluids

## 2018-11-26 ENCOUNTER — Encounter: Payer: Self-pay | Admitting: Internal Medicine

## 2018-11-26 NOTE — Patient Instructions (Signed)
Amoxicillin 500 mg 3 times a day for 10 days.  Rest and drink plenty of fluids.

## 2018-12-12 ENCOUNTER — Telehealth: Payer: Self-pay | Admitting: Internal Medicine

## 2018-12-12 ENCOUNTER — Encounter: Payer: Self-pay | Admitting: Internal Medicine

## 2018-12-12 DIAGNOSIS — Z7721 Contact with and (suspected) exposure to potentially hazardous body fluids: Secondary | ICD-10-CM

## 2018-12-12 MED ORDER — OSELTAMIVIR PHOSPHATE 75 MG PO CAPS: 75.0000 mg | ORAL_CAPSULE | Freq: Every day | ORAL | 0 refills | Status: DC

## 2018-12-12 NOTE — Telephone Encounter (Signed)
Possible flu exposure to husband who has flu like symptoms but tested negative in office. He has been placed on Tamiflu. Will place her on Tamiflu 75 mg daily x 7 days

## 2018-12-29 ENCOUNTER — Other Ambulatory Visit: Payer: PPO | Admitting: Internal Medicine

## 2018-12-29 DIAGNOSIS — E119 Type 2 diabetes mellitus without complications: Secondary | ICD-10-CM | POA: Diagnosis not present

## 2018-12-30 LAB — HEMOGLOBIN A1C
Hgb A1c MFr Bld: 5.5 % of total Hgb (ref <5.7)
Mean Plasma Glucose: 111 (calc)
eAG (mmol/L): 6.2 (calc)

## 2019-01-01 ENCOUNTER — Encounter: Payer: Self-pay | Admitting: Internal Medicine

## 2019-01-01 ENCOUNTER — Ambulatory Visit (INDEPENDENT_AMBULATORY_CARE_PROVIDER_SITE_OTHER): Payer: PPO | Admitting: Internal Medicine

## 2019-01-01 VITALS — BP 100/70 | HR 78 | Ht 65.0 in | Wt 146.0 lb

## 2019-01-01 DIAGNOSIS — L820 Inflamed seborrheic keratosis: Secondary | ICD-10-CM

## 2019-01-01 DIAGNOSIS — Z87898 Personal history of other specified conditions: Secondary | ICD-10-CM | POA: Diagnosis not present

## 2019-01-01 DIAGNOSIS — R7302 Impaired glucose tolerance (oral): Secondary | ICD-10-CM | POA: Diagnosis not present

## 2019-01-01 NOTE — Progress Notes (Signed)
   Subjective:    Patient ID: Natalie [name], female    DOB: 03-Nov-1945, 73 y.o.   MRN: [id number]  HPI She is here today for 67-month recheck.  Hemoglobin A1c is excellent at 5.5%.  She watches her diet.  She had an episode of syncope had a [place] in November after prolonged standing.  Has not had any further episodes but has been afraid to consume alcohol.  Advised that she may have 1 alcoholic beverage in the evenings with dinner if she is out with friends.  Explained that what she likely had was vasovagal syncope from prolonged standing in a hot environment.  She was not intoxicated.  She has a lesion posterior right upper arm which looks like an inflamed seborrheic keratosis.  She will see dermatologist to have this removed.    Review of Systems see above     Objective:   Physical Exam Weight has increased 4 pounds from last visit.  Neck is supple without JVD thyromegaly or carotid bruits.  No thyromegaly.  Chest clear to auscultation.  Cardiac exam regular rate and rhythm normal S1 and S2.  Extremities without edema.  Inflamed looking keratotic lesion right mid upper arm       Assessment & Plan:  Inflamed seborrheic keratosis  Impaired glucose tolerance  History of vasovagal syncope  Plan: See above recommendations.  She will return in 6 months for physical examination.

## 2019-01-01 NOTE — Patient Instructions (Signed)
Please book physical exam in 6 months.  Continue to watch diet and exercise.  Have dermatologist look at inflamed keratosis right arm.  May have 1 alcoholic drink when going out with friends at dinner

## 2019-01-02 ENCOUNTER — Ambulatory Visit: Payer: PPO | Admitting: Internal Medicine

## 2019-01-08 DIAGNOSIS — D485 Neoplasm of uncertain behavior of skin: Secondary | ICD-10-CM | POA: Diagnosis not present

## 2019-01-08 DIAGNOSIS — L820 Inflamed seborrheic keratosis: Secondary | ICD-10-CM | POA: Diagnosis not present

## 2019-01-08 DIAGNOSIS — L814 Other melanin hyperpigmentation: Secondary | ICD-10-CM | POA: Diagnosis not present

## 2019-01-08 DIAGNOSIS — L579 Skin changes due to chronic exposure to nonionizing radiation, unspecified: Secondary | ICD-10-CM | POA: Diagnosis not present

## 2019-07-05 ENCOUNTER — Other Ambulatory Visit: Payer: PPO | Admitting: Internal Medicine

## 2019-07-05 ENCOUNTER — Other Ambulatory Visit: Payer: Self-pay

## 2019-07-05 DIAGNOSIS — M818 Other osteoporosis without current pathological fracture: Secondary | ICD-10-CM

## 2019-07-05 DIAGNOSIS — Z0000 Encounter for general adult medical examination without abnormal findings: Secondary | ICD-10-CM

## 2019-07-05 DIAGNOSIS — N6012 Diffuse cystic mastopathy of left breast: Secondary | ICD-10-CM

## 2019-07-05 DIAGNOSIS — R7302 Impaired glucose tolerance (oral): Secondary | ICD-10-CM

## 2019-07-06 LAB — LIPID PANEL
Cholesterol: 177 mg/dL (ref <200)
HDL: 50 mg/dL (ref >=50)
LDL Cholesterol (Calc): 110 mg/dL (calc) — ABNORMAL HIGH
Non-HDL Cholesterol (Calc): 127 mg/dL (calc) (ref <130)
Total CHOL/HDL Ratio: 3.5 (calc) (ref <5.0)
Triglycerides: 77 mg/dL (ref <150)

## 2019-07-06 LAB — CBC WITH DIFFERENTIAL/PLATELET
Absolute Monocytes: 464 cells/uL (ref 200–950)
Basophils Absolute: 49 cells/uL (ref 0–200)
Basophils Relative: 0.9 %
Eosinophils Absolute: 194 cells/uL (ref 15–500)
Eosinophils Relative: 3.6 %
HCT: 40.9 % (ref 35.0–45.0)
Hemoglobin: 13.8 g/dL (ref 11.7–15.5)
Lymphs Abs: 1582 cells/uL (ref 850–3900)
MCH: 29.6 pg (ref 27.0–33.0)
MCHC: 33.7 g/dL (ref 32.0–36.0)
MCV: 87.8 fL (ref 80.0–100.0)
MPV: 10.4 fL (ref 7.5–12.5)
Monocytes Relative: 8.6 %
Neutro Abs: 3110 cells/uL (ref 1500–7800)
Neutrophils Relative %: 57.6 %
Platelets: 183 10*3/uL (ref 140–400)
RBC: 4.66 10*6/uL (ref 3.80–5.10)
RDW: 13.4 % (ref 11.0–15.0)
Total Lymphocyte: 29.3 %
WBC: 5.4 10*3/uL (ref 3.8–10.8)

## 2019-07-06 LAB — COMPLETE METABOLIC PANEL WITH GFR
AG Ratio: 1.8 (calc) (ref 1.0–2.5)
ALT: 17 U/L (ref 6–29)
AST: 23 U/L (ref 10–35)
Albumin: 4.4 g/dL (ref 3.6–5.1)
Alkaline phosphatase (APISO): 52 U/L (ref 37–153)
BUN: 18 mg/dL (ref 7–25)
CO2: 27 mmol/L (ref 20–32)
Calcium: 9.7 mg/dL (ref 8.6–10.4)
Chloride: 109 mmol/L (ref 98–110)
Creat: 0.62 mg/dL (ref 0.60–0.93)
GFR, Est African American: 105 mL/min/{1.73_m2} (ref >=60)
GFR, Est Non African American: 91 mL/min/{1.73_m2} (ref >=60)
Globulin: 2.4 g/dL (calc) (ref 1.9–3.7)
Glucose, Bld: 91 mg/dL (ref 65–99)
Potassium: 4.1 mmol/L (ref 3.5–5.3)
Sodium: 144 mmol/L (ref 135–146)
Total Bilirubin: 0.9 mg/dL (ref 0.2–1.2)
Total Protein: 6.8 g/dL (ref 6.1–8.1)

## 2019-07-06 LAB — HEMOGLOBIN A1C
Hgb A1c MFr Bld: 5.4 % of total Hgb (ref <5.7)
Mean Plasma Glucose: 108 (calc)
eAG (mmol/L): 6 (calc)

## 2019-07-06 LAB — TSH: TSH: 1.98 mIU/L (ref 0.40–4.50)

## 2019-07-10 ENCOUNTER — Ambulatory Visit (INDEPENDENT_AMBULATORY_CARE_PROVIDER_SITE_OTHER): Payer: PPO | Admitting: Internal Medicine

## 2019-07-10 ENCOUNTER — Other Ambulatory Visit: Payer: Self-pay

## 2019-07-10 ENCOUNTER — Encounter: Payer: Self-pay | Admitting: Internal Medicine

## 2019-07-10 VITALS — BP 110/80 | HR 72 | Ht 64.5 in | Wt 143.0 lb

## 2019-07-10 DIAGNOSIS — Z23 Encounter for immunization: Secondary | ICD-10-CM | POA: Diagnosis not present

## 2019-07-10 DIAGNOSIS — Z8659 Personal history of other mental and behavioral disorders: Secondary | ICD-10-CM | POA: Diagnosis not present

## 2019-07-10 DIAGNOSIS — Z0000 Encounter for general adult medical examination without abnormal findings: Secondary | ICD-10-CM | POA: Diagnosis not present

## 2019-07-10 LAB — POCT URINALYSIS DIPSTICK
Appearance: NEGATIVE
Bilirubin, UA: NEGATIVE
Blood, UA: NEGATIVE
Glucose, UA: NEGATIVE
Ketones, UA: NEGATIVE
Leukocytes, UA: NEGATIVE
Nitrite, UA: NEGATIVE
Odor: NEGATIVE
Protein, UA: NEGATIVE
Spec Grav, UA: 1.01 (ref 1.010–1.025)
Urobilinogen, UA: 0.2 E.U./dL
pH, UA: 6.5 (ref 5.0–8.0)

## 2019-07-10 NOTE — Patient Instructions (Signed)
Flu vaccine given. Continue same meds and RTC in 6 months. Covid-19 precautions discussed.

## 2019-07-29 NOTE — Progress Notes (Signed)
Subjective:    Patient ID: Natalie [name], female    DOB: Dec 20, 1945, 73 y.o.   MRN: [id number]  HPI 73 year old Female for health maintenance exam, Medicare wellness exam and evaluation of medical issues.  She has a history of diabetes mellitus treated with Januvia.  She did well with diabetic control, diet and exercise and now no longer requires Januvia.  Diabetes was diagnosed July 2016 after presenting with urinary frequency.  Hemoglobin A1c at that time was 13.4% and is now 5.4% just with diet and exercise.  Her lipid panel is excellent considering the pandemic.  LDL has increased from 96 to 110.  She and her husband have been careful.  They sit outside and have dinner with friends sometimes.  History of anxiety.  Reminded about annual eye exam.  Past medical history: Anxiety especially bothersome with airplane flight.  History of fractured foot secondary to a fall 1986.  History of right shoulder adhesive capsulitis November 2010.  History of vaginal hysterectomy with enterocele and rectocele repair 2004.  Had colonoscopy in May 2012 showing hyperplastic polyps.  She was hospitalized in Spring 2012 after a bout of gastroenteritis resulting in partial small bowel obstruction.  Was treated with IV fluids bowel rest and subsequently obstruction resolved.  Social history: She is married.  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  She does not smoke.  Social alcohol consumption.  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  She has not worked outside the home since 1996.  2 adult children.  Husband has heart issues.  They enjoy traveling.  Family history: Father died at age 64 due to diffuse alveolar damage of the lung.  Mother died at age 103 of complications of pneumonia.  One sister in good health.    Review of Systems  Constitutional: Negative.   All other systems reviewed and are negative.      Objective:   Physical Exam Constitutional:      Appearance: She is normal weight.  HENT:     Head:  Normocephalic.     Right Ear: Tympanic membrane normal.     Left Ear: Tympanic membrane normal.     Nose: Nose normal.     Mouth/Throat:     Mouth: Mucous membranes are moist.  Eyes:     Extraocular Movements: Extraocular movements intact.     Conjunctiva/sclera: Conjunctivae normal.     Pupils: Pupils are equal, round, and reactive to light.  Neck:     Musculoskeletal: Neck supple. No neck rigidity.     Comments: No thyromegaly Cardiovascular:     Rate and Rhythm: Normal rate and regular rhythm.     Pulses: Normal pulses.     Heart sounds: Normal heart sounds. No murmur.  Pulmonary:     Effort: Pulmonary effort is normal.     Breath sounds: Normal breath sounds. No wheezing or rales.     Comments: Breast without masses Abdominal:     General: Bowel sounds are normal. There is no distension.     Palpations: Abdomen is soft. There is no mass.     Tenderness: There is no abdominal tenderness. There is no right CVA tenderness, left CVA tenderness, guarding or rebound.     Hernia: No hernia is present.  Genitourinary:    Comments: Bimanual normal.  History of vaginal hysterectomy. Musculoskeletal:     Right lower leg: No edema.     Left lower leg: No edema.  Lymphadenopathy:     Cervical: No cervical adenopathy.  Skin:    General: Skin is warm and dry.     Findings: No rash.  Neurological:     General: No focal deficit present.     Mental Status: She is oriented to person, place, and time.     Cranial Nerves: No cranial nerve deficit.     Gait: Gait normal.  Psychiatric:        Mood and Affect: Mood normal.        Behavior: Behavior normal.        Thought Content: Thought content normal.        Judgment: Judgment normal.    Blood pressure 110/80 pulse 72 temperature 98 degrees orally weight 143 pounds height 5 feet 4.5 inches BMI 24.17       Assessment & Plan:  History of diabetes now diet controlled  History of anxiety particularly with airplane flight  Plan: I  am pleased that she has been able to reverse her diabetes and control it with diet alone.  She may return in 1 year or as needed.  Subjective:   Patient presents for Medicare Annual/Subsequent preventive examination.  Review Past Medical/Family/Social: See above   Risk Factors  Current exercise habits: Exercises regularly Dietary issues discussed: Follows low-fat low carbohydrate diet  Cardiac risk factors: Remote history of diabetes that is currently controlled with diet alone  Depression Screen  (Note: if answer to either of the following is "Yes", a more complete depression screening is indicated)   Over the past two weeks, have you felt down, depressed or hopeless? No  Over the past two weeks, have you felt little interest or pleasure in doing things? No Have you lost interest or pleasure in daily life? No Do you often feel hopeless? No Do you cry easily over simple problems? No   Activities of Daily Living  In your present state of health, do you have any difficulty performing the following activities?:   Driving? No  Managing money? No  Feeding yourself? No  Getting from bed to chair? No  Climbing a flight of stairs? No  Preparing food and eating?: No  Bathing or showering? No  Getting dressed: No  Getting to the toilet? No  Using the toilet:No  Moving around from place to place: No  In the past year have you fallen or had a near fall?:No  Are you sexually active? yes Do you have more than one partner? No   Hearing Difficulties: No  Do you often ask people to speak up or repeat themselves? No  Do you experience ringing or noises in your ears? No  Do you have difficulty understanding soft or whispered voices? No  Do you feel that you have a problem with memory? No Do you often misplace items? No    Home Safety:  Do you have a smoke alarm at your residence? Yes Do you have grab bars in the bathroom?  No Do you have throw rugs in your house?  Yes   Cognitive  Testing  Alert? Yes Normal Appearance?Yes  Oriented to person? Yes Place? Yes  Time? Yes  Recall of three objects? Yes  Can perform simple calculations? Yes  Displays appropriate judgment?Yes  Can read the correct time from a watch face?Yes   List the Names of Other Physician/Practitioners you currently use:  See referral list for the physicians patient is currently seeing.  No specialists at present   Review of Systems: See above   Objective:     General appearance:  Appears younger than stated age and trim Head: Normocephalic, without obvious abnormality, atraumatic  Eyes: conj clear, EOMi PEERLA  Ears: normal TM's and external ear canals both ears  Nose: Nares normal. Septum midline. Mucosa normal. No drainage or sinus tenderness.  Throat: lips, mucosa, and tongue normal; teeth and gums normal  Neck: no adenopathy, no carotid bruit, no JVD, supple, symmetrical, trachea midline and thyroid not enlarged, symmetric, no tenderness/mass/nodules  No CVA tenderness.  Lungs: clear to auscultation bilaterally  Breasts: normal appearance, no masses Heart: regular rate and rhythm, S1, S2 normal, no murmur, click, rub or gallop  Abdomen: soft, non-tender; bowel sounds normal; no masses, no organomegaly  Musculoskeletal: ROM normal in all joints, no crepitus, no deformity, Normal muscle strengthen. Back  is symmetric, no curvature. Skin: Skin color, texture, turgor normal. No rashes or lesions  Lymph nodes: Cervical, supraclavicular, and axillary nodes normal.  Neurologic: CN 2 -12 Normal, Normal symmetric reflexes. Normal coordination and gait  Psych: Alert & Oriented x 3, Mood appear stable.    Assessment:    Annual wellness medicare exam   Plan:    During the course of the visit the patient was educated and counseled about appropriate screening and preventive services including:   Annual mammogram  Annual flu vaccine     Patient Instructions (the written plan) was given  to the patient.  Medicare Attestation  I have personally reviewed:  The patient's medical and social history  Their use of alcohol, tobacco or illicit drugs  Their current medications and supplements  The patient's functional ability including ADLs,fall risks, home safety risks, cognitive, and hearing and visual impairment  Diet and physical activities  Evidence for depression or mood disorders  The patient's weight, height, BMI, and visual acuity have been recorded in the chart. I have made referrals, counseling, and provided education to the patient based on review of the above and I have provided the patient with a written personalized care plan for preventive services.

## 2019-08-15 ENCOUNTER — Other Ambulatory Visit: Payer: Self-pay | Admitting: Internal Medicine

## 2019-08-15 DIAGNOSIS — Z1231 Encounter for screening mammogram for malignant neoplasm of breast: Secondary | ICD-10-CM

## 2019-09-24 ENCOUNTER — Telehealth: Payer: Self-pay | Admitting: Internal Medicine

## 2019-09-24 ENCOUNTER — Other Ambulatory Visit: Payer: Self-pay

## 2019-09-24 ENCOUNTER — Ambulatory Visit (INDEPENDENT_AMBULATORY_CARE_PROVIDER_SITE_OTHER): Payer: PPO | Admitting: Internal Medicine

## 2019-09-24 ENCOUNTER — Encounter: Payer: Self-pay | Admitting: Internal Medicine

## 2019-09-24 VITALS — Ht 64.5 in | Wt 143.0 lb

## 2019-09-24 DIAGNOSIS — R7302 Impaired glucose tolerance (oral): Secondary | ICD-10-CM

## 2019-09-24 DIAGNOSIS — Z20828 Contact with and (suspected) exposure to other viral communicable diseases: Secondary | ICD-10-CM | POA: Diagnosis not present

## 2019-09-24 DIAGNOSIS — Z20822 Contact with and (suspected) exposure to covid-19: Secondary | ICD-10-CM

## 2019-09-24 NOTE — Progress Notes (Signed)
   Subjective:    Patient ID: Natalie [name], female    DOB: [birth year], 73 y.o.   MRN: [id number]  HPI 73 year old Female with history of impaired glucose tolerance attended family gathering in [place] on Saturday.  She was wearing a mask part of the time but many of the family members were not.  Her son has tested positive for COVID-19.  He may have had exposure through his work.  She is worried now about her exposure as well as her husband's exposure as he has a heart condition.  She is asking about taking supplements and about blood type affecting her risks.  It is okay to take for her to take a Vitamin D supplement.  She is already taking calcium and vitamin D.She may also take zinc.  She and her husband subsequently visited with 2 friends yesterday in a close setting at a home watching football game for several hours.  She is seen today by interactive audio and video telecommunications.  She is identified using 2 identifiers as Natalie [name], a patient in this practice.  She is agreeable to visit in this format today.  Patient has a history of anxiety and is clearly anxious about this exposure.  Pictures were made during the event on Saturday with none of the protest but is wearing a mask.  A meal was shared with people not wearing a mask.  They were outdoors during all of these events.  No one else is symptomatic at this time    Review of Systems see above days she has a history of impaired glucose tolerance which is well controlled     Objective:   Physical Exam Reports being afebrile and asymptomatic       Assessment & Plan:  Close exposure to COVID-19  Plan: Patient says that an acquaintance of theirs has arranged for her to be tested in [place].  Otherwise, she may go to testing center on [facility].  She is to quarantine until test results are back.  It is recommended that she continue to quarantine for 7 to 10 days given husband's health issues.  15  minutes spent with patient

## 2019-09-24 NOTE — Telephone Encounter (Signed)
Scheduled Virtual Visit °

## 2019-09-24 NOTE — Patient Instructions (Signed)
Recommend testing for COVID-19 and then quarantining until test results are back and preferably 7 to 10 days.

## 2019-09-24 NOTE — Telephone Encounter (Signed)
She may go to testing center with her husband and then they have to quarantine for 7-10 days thereafter.

## 2019-09-24 NOTE — Telephone Encounter (Signed)
Lacrystal Ursery [phone]  Allannah called to say on Saturday they had a get together with their children it was outside, she wore mask part of the time, no one else did and yesterday her son tested positive for  COVID. What should they do. Sop her and her husband have now been exposed.  Setting virtual visit.

## 2019-10-01 NOTE — Telephone Encounter (Signed)
Called Natalie [name] back and she had spoke with [facility] and they said since she had negative COVID and no symptoms she would be okay to come in.

## 2019-10-01 NOTE — Telephone Encounter (Addendum)
Nico called to say that their COVID test came back negative, no symptoms and she was wandering if she should still go ahead with mammogram, I suggested she call them and see what their policy and procedures was.

## 2019-10-01 NOTE — Telephone Encounter (Signed)
Agree. I would think it would be wise to avoid public places for 10 days after exposure.

## 2019-10-03 ENCOUNTER — Other Ambulatory Visit: Payer: Self-pay

## 2019-10-03 ENCOUNTER — Ambulatory Visit
Admission: RE | Admit: 2019-10-03 | Discharge: 2019-10-03 | Disposition: A | Discharge status: Home / Self Care | Payer: PPO | Source: Ambulatory Visit | Attending: Internal Medicine | Admitting: Internal Medicine

## 2019-10-03 DIAGNOSIS — Z1231 Encounter for screening mammogram for malignant neoplasm of breast: Secondary | ICD-10-CM

## 2019-10-16 DIAGNOSIS — D225 Melanocytic nevi of trunk: Secondary | ICD-10-CM | POA: Diagnosis not present

## 2019-10-16 DIAGNOSIS — L814 Other melanin hyperpigmentation: Secondary | ICD-10-CM | POA: Diagnosis not present

## 2019-10-16 DIAGNOSIS — D1801 Hemangioma of skin and subcutaneous tissue: Secondary | ICD-10-CM | POA: Diagnosis not present

## 2019-10-16 DIAGNOSIS — L821 Other seborrheic keratosis: Secondary | ICD-10-CM | POA: Diagnosis not present

## 2019-10-16 DIAGNOSIS — L579 Skin changes due to chronic exposure to nonionizing radiation, unspecified: Secondary | ICD-10-CM | POA: Diagnosis not present

## 2019-12-02 ENCOUNTER — Ambulatory Visit: Payer: PPO

## 2019-12-07 ENCOUNTER — Ambulatory Visit: Payer: PPO

## 2019-12-13 ENCOUNTER — Other Ambulatory Visit: Payer: Self-pay

## 2019-12-13 ENCOUNTER — Ambulatory Visit: Payer: PPO

## 2019-12-13 ENCOUNTER — Telehealth: Payer: Self-pay | Admitting: Internal Medicine

## 2019-12-13 ENCOUNTER — Ambulatory Visit (INDEPENDENT_AMBULATORY_CARE_PROVIDER_SITE_OTHER): Payer: PPO | Admitting: Internal Medicine

## 2019-12-13 ENCOUNTER — Encounter: Payer: Self-pay | Admitting: Internal Medicine

## 2019-12-13 VITALS — BP 106/66 | HR 88 | Temp 97.6°F | Ht 64.5 in | Wt 143.0 lb

## 2019-12-13 DIAGNOSIS — R6889 Other general symptoms and signs: Secondary | ICD-10-CM

## 2019-12-13 DIAGNOSIS — F411 Generalized anxiety disorder: Secondary | ICD-10-CM

## 2019-12-13 NOTE — Telephone Encounter (Signed)
Agree virtual visit.

## 2019-12-13 NOTE — Telephone Encounter (Signed)
Natalie [name] [phone]  Sophiya called to say she had Moderna Vaccine on 12/01/2019. On 12/11/2019 her sugar dropped to 78, she got it back up, however that night she started running fever 100.7, chills, then yesterday she felt fine but last night she felt like she had fever again and took some tylenol, also having chills. This morning so far she feels fine just concerned about having fever for last 2 days. NO COVID exposure. I let her know we would need to do virtual visit.

## 2019-12-13 NOTE — Telephone Encounter (Signed)
Appointment scheduled.

## 2019-12-13 NOTE — Progress Notes (Signed)
   Subjective:    Patient ID: Natalie [name], female    DOB: Feb 16, 1946, 74 y.o.   MRN: [id number]  HPI 74 year old Female seen today via interactive audio and video telecommunications due to Coronavirus pandemic.  She is identified using 2 identifiers as Natalie [name], a patient in this practice.  She is agreeable to visit in this format today.  Patient says that 2 nights ago she felt hot and got up and took her temperature and that she had low-grade fever.  She felt chilly before going to bed that evening.  Her blood sugar was a bit low at 78 earlier that day.  Last evening she felt like she had fever again.  She took some Tylenol and slept well until this morning.  She is wondering if there is a problem with thermometer.  Husband went out and bought a new one.   Patient received the majority of vaccine at [facility] in [place] on January 30.  I do not think the symptoms are related to the vaccine.    Review of Systems She has had no nausea, vomiting, or diarrhea.  No cough or dysgeusia.  No known COVID-19 exposure.  Has only been to grocery store recently.  She and her husband go by the tire company and pick up the deposit but they do not go inside.  It is brought to the car by their son who has not been ill.     Objective:   Physical Exam  Reports today that she is afebrile.  She is seen virtually in no acute distress.  No cough.  No nasal congestion.  She is anxious however.  We talked for about 10 to 15 minutes regarding the symptoms and how to go forward.      Assessment & Plan:  Possible fever-could be an error due to defective thermometer.  They have purchased a new one and will continue to monitor her temp.  She has no other specific symptoms.  The low blood sugar could have just been a transient phenomenon.  I do not think it is necessarily related to her fever.  No known COVID-19 exposure.  She has had 1 vaccine on January 30 which should  provide about 50%  immunity after couple of weeks or so.

## 2019-12-23 NOTE — Patient Instructions (Addendum)
Continue to monitor blood pressure with new thermometer a nd call if elevated.

## 2020-01-08 ENCOUNTER — Other Ambulatory Visit: Payer: Self-pay

## 2020-01-08 ENCOUNTER — Other Ambulatory Visit: Payer: PPO | Admitting: Internal Medicine

## 2020-01-08 DIAGNOSIS — R7302 Impaired glucose tolerance (oral): Secondary | ICD-10-CM

## 2020-01-08 DIAGNOSIS — E7800 Pure hypercholesterolemia, unspecified: Secondary | ICD-10-CM

## 2020-01-09 LAB — HEPATIC FUNCTION PANEL
AG Ratio: 1.6 (calc) (ref 1.0–2.5)
ALT: 18 U/L (ref 6–29)
AST: 23 U/L (ref 10–35)
Albumin: 4.5 g/dL (ref 3.6–5.1)
Alkaline phosphatase (APISO): 52 U/L (ref 37–153)
Bilirubin, Direct: 0.1 mg/dL (ref 0.0–0.2)
Globulin: 2.8 g/dL (calc) (ref 1.9–3.7)
Indirect Bilirubin: 0.5 mg/dL (calc) (ref 0.2–1.2)
Total Bilirubin: 0.6 mg/dL (ref 0.2–1.2)
Total Protein: 7.3 g/dL (ref 6.1–8.1)

## 2020-01-09 LAB — LIPID PANEL
Cholesterol: 180 mg/dL (ref <200)
HDL: 53 mg/dL (ref >=50)
LDL Cholesterol (Calc): 108 mg/dL (calc) — ABNORMAL HIGH
Non-HDL Cholesterol (Calc): 127 mg/dL (calc) (ref <130)
Total CHOL/HDL Ratio: 3.4 (calc) (ref <5.0)
Triglycerides: 95 mg/dL (ref <150)

## 2020-01-09 LAB — HEMOGLOBIN A1C
Hgb A1c MFr Bld: 5.7 % of total Hgb — ABNORMAL HIGH (ref <5.7)
Mean Plasma Glucose: 117 (calc)
eAG (mmol/L): 6.5 (calc)

## 2020-01-10 ENCOUNTER — Ambulatory Visit (INDEPENDENT_AMBULATORY_CARE_PROVIDER_SITE_OTHER): Payer: PPO | Admitting: Internal Medicine

## 2020-01-10 ENCOUNTER — Encounter: Payer: Self-pay | Admitting: Internal Medicine

## 2020-01-10 ENCOUNTER — Other Ambulatory Visit: Payer: Self-pay

## 2020-01-10 VITALS — BP 110/80 | HR 77 | Temp 98.0°F | Ht 64.5 in | Wt 146.0 lb

## 2020-01-10 DIAGNOSIS — Z9189 Other specified personal risk factors, not elsewhere classified: Secondary | ICD-10-CM | POA: Diagnosis not present

## 2020-01-10 DIAGNOSIS — Z8659 Personal history of other mental and behavioral disorders: Secondary | ICD-10-CM | POA: Diagnosis not present

## 2020-01-10 DIAGNOSIS — R7302 Impaired glucose tolerance (oral): Secondary | ICD-10-CM | POA: Diagnosis not present

## 2020-01-10 DIAGNOSIS — E7800 Pure hypercholesterolemia, unspecified: Secondary | ICD-10-CM

## 2020-01-10 NOTE — Patient Instructions (Addendum)
It was a pleasure to see you today.  Your weight has increased 3 pounds.  Hemoglobin A1c stable.  Continue diet and exercise regimen.  LDL cholesterol is about the same.  Follow-up in 6 months.  Discussion regarding travel plans with family

## 2020-01-10 NOTE — Progress Notes (Signed)
   Subjective:    Patient ID: Natalie [name], female    DOB: [birth year], 74 y.o.   MRN: [id number]  HPI  74 year old Female for 6 month recheck. Has hx of glucose intolerance. She has had Covid-19 vaccines. Husband has cardiac condition and he has been vaccinated. They want to travel with nanny, children and grandchildren to [place] for Spring break.Suggested that everyone including nanny be Covid tested at same time for safety reasons.Spent time reviewing Covid-19 transmission. They have already had exposure with their son who had Covid-19 a few months ago.  She has history of glucose intolerance.  In September LDL was 110 and previously had been normal.  She is not on statin medication.  LDL is now 108 instead of 110.  She needs to watch her diet and exercise a bit more.  Hemoglobin A1c is stable at 5.7% and was 5.4% in September 2020.  She will continue to monitor this at home and watch her diet.  She had COVID-19 vaccines December 01, 2019 December 29, 2019  Review of Systems no new complaints.  History of anxiety.     Objective:   Physical Exam Blood pressure 110/80 pulse 77 temperature 98 degrees orally pulse oximetry 98% weight 146 pounds BMI 24.67 Previous weight in September  was 143 pounds  Skin warm and dry.  Nodes none.  No carotid bruits.  No thyromegaly.  Chest clear to auscultation.  Cardiac exam regular rate and rhythm normal S1 and S2 without murmurs or gallops.  Extremities without edema.  Affect thought and judgment are within normal limits.      Assessment & Plan:  Impaired glucose tolerance-stable with  diet only.  Continue to watch diet and continue to exercise.  Travel plans-discussion regarding COVID-19.  Even though she has had the vaccine I think all the parties going on trip should be tested around the same time before travel including the need any  Elevated LDL-about the same as in September.  Continue to work on diet and exercise.  Weight  gain of 3 pounds-continue diet and exercise regimen  Plan: Continue diet and exercise regimen.  Follow-up in 6 months for health maintenance exam and Medicare wellness visit along with fasting labs.  30 minutes spent with patient

## 2020-02-14 DIAGNOSIS — L209 Atopic dermatitis, unspecified: Secondary | ICD-10-CM | POA: Diagnosis not present

## 2020-06-10 DIAGNOSIS — E119 Type 2 diabetes mellitus without complications: Secondary | ICD-10-CM | POA: Diagnosis not present

## 2020-06-10 DIAGNOSIS — H04123 Dry eye syndrome of bilateral lacrimal glands: Secondary | ICD-10-CM | POA: Diagnosis not present

## 2020-06-10 DIAGNOSIS — H5203 Hypermetropia, bilateral: Secondary | ICD-10-CM | POA: Diagnosis not present

## 2020-06-10 DIAGNOSIS — Z83518 Family history of other specified eye disorder: Secondary | ICD-10-CM | POA: Diagnosis not present

## 2020-06-10 DIAGNOSIS — H52203 Unspecified astigmatism, bilateral: Secondary | ICD-10-CM | POA: Diagnosis not present

## 2020-06-10 DIAGNOSIS — H524 Presbyopia: Secondary | ICD-10-CM | POA: Diagnosis not present

## 2020-06-10 DIAGNOSIS — Z83511 Family history of glaucoma: Secondary | ICD-10-CM | POA: Diagnosis not present

## 2020-06-10 DIAGNOSIS — H11153 Pinguecula, bilateral: Secondary | ICD-10-CM | POA: Diagnosis not present

## 2020-06-10 DIAGNOSIS — H25813 Combined forms of age-related cataract, bilateral: Secondary | ICD-10-CM | POA: Diagnosis not present

## 2020-06-10 DIAGNOSIS — H43393 Other vitreous opacities, bilateral: Secondary | ICD-10-CM | POA: Diagnosis not present

## 2020-07-03 ENCOUNTER — Other Ambulatory Visit: Payer: Self-pay

## 2020-07-03 ENCOUNTER — Other Ambulatory Visit: Payer: PPO | Admitting: Internal Medicine

## 2020-07-03 DIAGNOSIS — Z1329 Encounter for screening for other suspected endocrine disorder: Secondary | ICD-10-CM | POA: Diagnosis not present

## 2020-07-03 DIAGNOSIS — Z13220 Encounter for screening for lipoid disorders: Secondary | ICD-10-CM | POA: Diagnosis not present

## 2020-07-03 DIAGNOSIS — R7302 Impaired glucose tolerance (oral): Secondary | ICD-10-CM | POA: Diagnosis not present

## 2020-07-03 DIAGNOSIS — K5732 Diverticulitis of large intestine without perforation or abscess without bleeding: Secondary | ICD-10-CM | POA: Diagnosis not present

## 2020-07-03 DIAGNOSIS — N6012 Diffuse cystic mastopathy of left breast: Secondary | ICD-10-CM | POA: Diagnosis not present

## 2020-07-03 DIAGNOSIS — E7800 Pure hypercholesterolemia, unspecified: Secondary | ICD-10-CM

## 2020-07-03 DIAGNOSIS — F419 Anxiety disorder, unspecified: Secondary | ICD-10-CM

## 2020-07-03 DIAGNOSIS — Z0000 Encounter for general adult medical examination without abnormal findings: Secondary | ICD-10-CM | POA: Diagnosis not present

## 2020-07-03 DIAGNOSIS — M810 Age-related osteoporosis without current pathological fracture: Secondary | ICD-10-CM

## 2020-07-04 LAB — COMPLETE METABOLIC PANEL WITH GFR
AG Ratio: 1.6 (calc) (ref 1.0–2.5)
ALT: 14 U/L (ref 6–29)
AST: 20 U/L (ref 10–35)
Albumin: 4.4 g/dL (ref 3.6–5.1)
Alkaline phosphatase (APISO): 56 U/L (ref 37–153)
BUN: 18 mg/dL (ref 7–25)
CO2: 25 mmol/L (ref 20–32)
Calcium: 9.5 mg/dL (ref 8.6–10.4)
Chloride: 109 mmol/L (ref 98–110)
Creat: 0.66 mg/dL (ref 0.60–0.93)
GFR, Est African American: 102 mL/min/{1.73_m2} (ref >=60)
GFR, Est Non African American: 88 mL/min/{1.73_m2} (ref >=60)
Globulin: 2.7 g/dL (calc) (ref 1.9–3.7)
Glucose, Bld: 94 mg/dL (ref 65–99)
Potassium: 4.2 mmol/L (ref 3.5–5.3)
Sodium: 144 mmol/L (ref 135–146)
Total Bilirubin: 0.5 mg/dL (ref 0.2–1.2)
Total Protein: 7.1 g/dL (ref 6.1–8.1)

## 2020-07-04 LAB — TSH: TSH: 2.05 mIU/L (ref 0.40–4.50)

## 2020-07-04 LAB — CBC WITH DIFFERENTIAL/PLATELET
Absolute Monocytes: 446 cells/uL (ref 200–950)
Basophils Absolute: 39 cells/uL (ref 0–200)
Basophils Relative: 0.8 %
Eosinophils Absolute: 172 cells/uL (ref 15–500)
Eosinophils Relative: 3.5 %
HCT: 39.7 % (ref 35.0–45.0)
Hemoglobin: 13.8 g/dL (ref 11.7–15.5)
Lymphs Abs: 1514 cells/uL (ref 850–3900)
MCH: 30.5 pg (ref 27.0–33.0)
MCHC: 34.8 g/dL (ref 32.0–36.0)
MCV: 87.6 fL (ref 80.0–100.0)
MPV: 11 fL (ref 7.5–12.5)
Monocytes Relative: 9.1 %
Neutro Abs: 2729 cells/uL (ref 1500–7800)
Neutrophils Relative %: 55.7 %
Platelets: 174 10*3/uL (ref 140–400)
RBC: 4.53 10*6/uL (ref 3.80–5.10)
RDW: 13 % (ref 11.0–15.0)
Total Lymphocyte: 30.9 %
WBC: 4.9 10*3/uL (ref 3.8–10.8)

## 2020-07-04 LAB — LIPID PANEL
Cholesterol: 170 mg/dL (ref <200)
HDL: 50 mg/dL (ref >=50)
LDL Cholesterol (Calc): 99 mg/dL (calc)
Non-HDL Cholesterol (Calc): 120 mg/dL (calc) (ref <130)
Total CHOL/HDL Ratio: 3.4 (calc) (ref <5.0)
Triglycerides: 113 mg/dL (ref <150)

## 2020-07-04 LAB — HEMOGLOBIN A1C
Hgb A1c MFr Bld: 5.6 % of total Hgb (ref <5.7)
Mean Plasma Glucose: 114 (calc)
eAG (mmol/L): 6.3 (calc)

## 2020-07-10 ENCOUNTER — Encounter: Payer: Self-pay | Admitting: Internal Medicine

## 2020-07-10 ENCOUNTER — Ambulatory Visit (INDEPENDENT_AMBULATORY_CARE_PROVIDER_SITE_OTHER): Payer: PPO | Admitting: Internal Medicine

## 2020-07-10 ENCOUNTER — Other Ambulatory Visit: Payer: Self-pay

## 2020-07-10 VITALS — BP 102/78 | HR 72 | Ht 64.5 in | Wt 149.0 lb

## 2020-07-10 DIAGNOSIS — Z1273 Encounter for screening for malignant neoplasm of ovary: Secondary | ICD-10-CM

## 2020-07-10 DIAGNOSIS — R7302 Impaired glucose tolerance (oral): Secondary | ICD-10-CM | POA: Diagnosis not present

## 2020-07-10 DIAGNOSIS — Z23 Encounter for immunization: Secondary | ICD-10-CM

## 2020-07-10 DIAGNOSIS — Z0000 Encounter for general adult medical examination without abnormal findings: Secondary | ICD-10-CM | POA: Diagnosis not present

## 2020-07-10 LAB — POCT URINALYSIS DIPSTICK
Appearance: NEGATIVE
Bilirubin, UA: NEGATIVE
Blood, UA: NEGATIVE
Glucose, UA: NEGATIVE
Ketones, UA: NEGATIVE
Leukocytes, UA: NEGATIVE
Nitrite, UA: NEGATIVE
Odor: NEGATIVE
Protein, UA: NEGATIVE
Spec Grav, UA: 1.015 (ref 1.010–1.025)
Urobilinogen, UA: 0.2 E.U./dL
pH, UA: 6.5 (ref 5.0–8.0)

## 2020-07-10 NOTE — Progress Notes (Signed)
Subjective:    Patient ID: Natalie [name], female    DOB: 10/12/1946, 74 y.o.   MRN: [id number]  HPI   74 year  Female for health maintenance exam Medicare wellness, and evaluation of medical problems.  She has a history of diabetes mellitus treated with Januvia.  She did well with diabetic control, diet and exercise now no longer requires Januvia.  Diabetes was diagnosed July 2016 after presenting with urinary frequency.  Hemoglobin A1c at that time was 13.4% and is now normal at 5.6%.  Lipid panel is normal.  Past medical history: She has a history of anxiety related to airplane flight.  History of fractured foot secondary to a fall in 1986.  History of right shoulder adhesive capsulitis November 2010.  History of vaginal hysterectomy with enterocele and rectocele repair 2004.  Had colonoscopy in May 2012 showing hyperplastic polyps.  She was hospitalized in the spring 2012 after a bout of gastroenteritis resulting in partial small bowel obstruction.  Was treated with IV fluids, bowel rest and subsequently obstruction resolved.  Social history: She is married.  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  She does not smoke.  Social alcohol consumption.  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  She has not worked outside the home since 1996.  2 adult children.  Husband has heart issues.  They enjoy traveling.  Family history: Father died at age 43 due to diffuse alveolar damage of the lung.  Mother died at age 5 of complications of pneumonia.  1 sister in good health.    Review of Systems  Constitutional: Negative.   Respiratory: Negative.   Cardiovascular: Negative.   Gastrointestinal: Negative.   Genitourinary: Negative.   Neurological: Negative.        Objective:   Physical Exam Constitutional:      Appearance: Normal appearance.  HENT:     Head: Normocephalic and atraumatic.     Right Ear: Tympanic membrane normal.     Left Ear: Tympanic membrane normal.     Nose: Nose normal.  Eyes:       General: No scleral icterus.       Right eye: No discharge.        Left eye: No discharge.     Extraocular Movements: Extraocular movements intact.     Pupils: Pupils are equal, round, and reactive to light.  Neck:     Vascular: No carotid bruit.     Comments: No thyromegaly Cardiovascular:     Rate and Rhythm: Normal rate and regular rhythm.     Heart sounds: Normal heart sounds. No murmur heard.   Abdominal:     General: Bowel sounds are normal. There is no distension.     Palpations: Abdomen is soft. There is no mass.     Tenderness: There is no abdominal tenderness. There is no right CVA tenderness, left CVA tenderness or rebound.  Genitourinary:    Comments: Bimanual normal.  Pap deferred with history of vaginal hysterectomy and age Musculoskeletal:     Cervical back: Neck supple.     Right lower leg: No edema.     Left lower leg: No edema.  Lymphadenopathy:     Cervical: No cervical adenopathy.  Skin:    General: Skin is warm and dry.     Findings: No rash.  Neurological:     General: No focal deficit present.     Mental Status: She is alert and oriented to person, place, and time.     Cranial  Nerves: No cranial nerve deficit.     Gait: Gait normal.  Psychiatric:        Mood and Affect: Mood normal.        Behavior: Behavior normal.        Thought Content: Thought content normal.           Assessment & Plan:  History of diabetes but now is diet controlled.  Hemoglobin A1c normal at 5.6%  History of anxiety particularly with airplane flight  Plan: She asked that I check her for ovarian cancer.  CA-125 was normal.  Lipid panel is normal.  TSH is normal.  CBC and c-Met are normal.  Patient is to return in 1 year or as needed.  Flu vaccine given today.  She has had 2  Moderna vaccines.  Tetanus immunization is up-to-date.  Has had 2 pneumococcal vaccines.  Subjective:   Patient presents for Medicare Annual/Subsequent preventive examination.  Review Past  Medical/Family/Social: See above   Risk Factors  Current exercise habits: Light exercise Dietary issues discussed: Follows low-fat low carbohydrate diet  Cardiac risk factors: None at the present time  Depression Screen  (Note: if answer to either of the following is "Yes", a more complete depression screening is indicated)   Over the past two weeks, have you felt down, depressed or hopeless? No  Over the past two weeks, have you felt little interest or pleasure in doing things? No Have you lost interest or pleasure in daily life? No Do you often feel hopeless? No Do you cry easily over simple problems? No   Activities of Daily Living  In your present state of health, do you have any difficulty performing the following activities?:   Driving? No  Managing money? No  Feeding yourself? No  Getting from bed to chair? No  Climbing a flight of stairs? No  Preparing food and eating?: No  Bathing or showering? No  Getting dressed: No  Getting to the toilet? No  Using the toilet:No  Moving around from place to place: No  In the past year have you fallen or had a near fall?:No  Are you sexually active? No  Do you have more than one partner? No   Hearing Difficulties: No  Do you often ask people to speak up or repeat themselves? No  Do you experience ringing or noises in your ears? No  Do you have difficulty understanding soft or whispered voices? No  Do you feel that you have a problem with memory? No Do you often misplace items? No    Home Safety:  Do you have a smoke alarm at your residence? Yes Do you have grab bars in the bathroom?  No  Do you have throw rugs in your house?  No   Cognitive Testing  Alert? Yes Normal Appearance?Yes  Oriented to person? Yes Place? Yes  Time? Yes  Recall of three objects? Yes  Can perform simple calculations? Yes  Displays appropriate judgment?Yes  Can read the correct time from a watch face?Yes   List the Names of Other  Physician/Practitioners you currently use:  See referral list for the physicians patient is currently seeing.     Review of Systems: See above   Objective:     General appearance: Appears younger than stated age Head: Normocephalic, without obvious abnormality, atraumatic  Eyes: conj clear, EOMi PEERLA  Ears: normal TM's and external ear canals both ears  Nose: Nares normal. Septum midline. Mucosa normal. No drainage or sinus  tenderness.  Throat: lips, mucosa, and tongue normal; teeth and gums normal  Neck: no adenopathy, no carotid bruit, no JVD, supple, symmetrical, trachea midline and thyroid not enlarged, symmetric, no tenderness/mass/nodules  No CVA tenderness.  Lungs: clear to auscultation bilaterally  Breasts: normal appearance, no masses or tenderness Heart: regular rate and rhythm, S1, S2 normal, no murmur, click, rub or gallop  Abdomen: soft, non-tender; bowel sounds normal; no masses, no organomegaly  Musculoskeletal: ROM normal in all joints, no crepitus, no deformity, Normal muscle strengthen. Back  is symmetric, no curvature. Skin: Skin color, texture, turgor normal. No rashes or lesions  Lymph nodes: Cervical, supraclavicular, and axillary nodes normal.  Neurologic: CN 2 -12 Normal, Normal symmetric reflexes. Normal coordination and gait  Psych: Alert & Oriented x 3, Mood appear stable.    Assessment:    Annual wellness medicare exam   Plan:    During the course of the visit the patient was educated and counseled about appropriate screening and preventive services including:   Annual flu vaccine     Patient Instructions (the written plan) was given to the patient.  Medicare Attestation  I have personally reviewed:  The patient's medical and social history  Their use of alcohol, tobacco or illicit drugs  Their current medications and supplements  The patient's functional ability including ADLs,fall risks, home safety risks, cognitive, and hearing and  visual impairment  Diet and physical activities  Evidence for depression or mood disorders  The patient's weight, height, BMI, and visual acuity have been recorded in the chart. I have made referrals, counseling, and provided education to the patient based on review of the above and I have provided the patient with a written personalized care plan for preventive services.

## 2020-07-11 LAB — MICROALBUMIN / CREATININE URINE RATIO
Creatinine, Urine: 89 mg/dL (ref 20–275)
Microalb Creat Ratio: 7 mcg/mg creat (ref <30)
Microalb, Ur: 0.6 mg/dL

## 2020-07-11 LAB — CA 125: CA 125: 7 U/mL (ref <35)

## 2020-07-26 NOTE — Patient Instructions (Signed)
It was a pleasure to see you today.  Return in 1 year or as needed.  Continue to watch her diet and exercise.  Flu vaccine given today.

## 2020-09-01 ENCOUNTER — Other Ambulatory Visit: Payer: Self-pay | Admitting: Internal Medicine

## 2020-09-01 DIAGNOSIS — Z1231 Encounter for screening mammogram for malignant neoplasm of breast: Secondary | ICD-10-CM

## 2020-10-16 DIAGNOSIS — D225 Melanocytic nevi of trunk: Secondary | ICD-10-CM | POA: Diagnosis not present

## 2020-10-16 DIAGNOSIS — D1801 Hemangioma of skin and subcutaneous tissue: Secondary | ICD-10-CM | POA: Diagnosis not present

## 2020-10-16 DIAGNOSIS — L821 Other seborrheic keratosis: Secondary | ICD-10-CM | POA: Diagnosis not present

## 2020-10-16 DIAGNOSIS — L814 Other melanin hyperpigmentation: Secondary | ICD-10-CM | POA: Diagnosis not present

## 2020-10-16 DIAGNOSIS — L579 Skin changes due to chronic exposure to nonionizing radiation, unspecified: Secondary | ICD-10-CM | POA: Diagnosis not present

## 2020-10-20 ENCOUNTER — Other Ambulatory Visit: Payer: Self-pay

## 2020-10-20 ENCOUNTER — Ambulatory Visit
Admission: RE | Admit: 2020-10-20 | Discharge: 2020-10-20 | Disposition: A | Discharge status: Home / Self Care | Payer: PPO | Source: Ambulatory Visit | Attending: Internal Medicine | Admitting: Internal Medicine

## 2020-10-20 DIAGNOSIS — Z1231 Encounter for screening mammogram for malignant neoplasm of breast: Secondary | ICD-10-CM | POA: Diagnosis not present

## 2021-01-06 ENCOUNTER — Other Ambulatory Visit: Payer: PPO | Admitting: Internal Medicine

## 2021-01-06 ENCOUNTER — Other Ambulatory Visit: Payer: Self-pay

## 2021-01-06 DIAGNOSIS — E7800 Pure hypercholesterolemia, unspecified: Secondary | ICD-10-CM | POA: Diagnosis not present

## 2021-01-06 DIAGNOSIS — R7302 Impaired glucose tolerance (oral): Secondary | ICD-10-CM

## 2021-01-07 ENCOUNTER — Telehealth: Payer: Self-pay | Admitting: Internal Medicine

## 2021-01-07 LAB — LIPID PANEL
Cholesterol: 178 mg/dL (ref <200)
HDL: 50 mg/dL (ref >=50)
LDL Cholesterol (Calc): 107 mg/dL (calc) — ABNORMAL HIGH
Non-HDL Cholesterol (Calc): 128 mg/dL (calc) (ref <130)
Total CHOL/HDL Ratio: 3.6 (calc) (ref <5.0)
Triglycerides: 112 mg/dL (ref <150)

## 2021-01-07 LAB — HEMOGLOBIN A1C
Hgb A1c MFr Bld: 5.6 % of total Hgb (ref <5.7)
Mean Plasma Glucose: 114 mg/dL
eAG (mmol/L): 6.3 mmol/L

## 2021-01-07 NOTE — Telephone Encounter (Signed)
Natalie [name] called to say she had been exposed to COVID-19 thru her son in law that took an at home COVID test last night and it was positive for COVID-19, he also went somewhere this morning and done one of the 1 hour PCR test and it was also positive for COVID-19. So we rescheduled her 6 month follow up for a couple of weeks to make sure she does not come down with COVID. She will call us back if she does so she can be treated, she was in the office on 01/06/2021 getting her labs before she knew she had been exposed.

## 2021-01-08 ENCOUNTER — Ambulatory Visit: Payer: PPO | Admitting: Internal Medicine

## 2021-01-22 ENCOUNTER — Other Ambulatory Visit: Payer: Self-pay

## 2021-01-22 ENCOUNTER — Ambulatory Visit (INDEPENDENT_AMBULATORY_CARE_PROVIDER_SITE_OTHER): Payer: PPO | Admitting: Internal Medicine

## 2021-01-22 ENCOUNTER — Encounter: Payer: Self-pay | Admitting: Internal Medicine

## 2021-01-22 VITALS — BP 102/70 | HR 72 | Ht 64.5 in | Wt 153.0 lb

## 2021-01-22 DIAGNOSIS — E7800 Pure hypercholesterolemia, unspecified: Secondary | ICD-10-CM

## 2021-01-22 DIAGNOSIS — R7302 Impaired glucose tolerance (oral): Secondary | ICD-10-CM | POA: Diagnosis not present

## 2021-01-22 DIAGNOSIS — Z6825 Body mass index (BMI) 25.0-25.9, adult: Secondary | ICD-10-CM | POA: Diagnosis not present

## 2021-01-22 NOTE — Patient Instructions (Signed)
It was a pleasure to see you today.  Continue to work on diet exercise and weight loss efforts.  Follow-up in 6 months for health maintenance exam and Medicare wellness visit.  Have a good time in [place] but be careful and wear a mask.

## 2021-01-22 NOTE — Progress Notes (Signed)
   Subjective:    Patient ID: Natalie [name], female    DOB: [birth year], 75 y.o.   MRN: [id number]  HPI  75 year old Female for 6 month recheck. Hgb AIC is excellent on diet alone at  5.6%. This is same as last check on July 03, 2020.Lipid panel also stable on diet alone with very slight elevation of LDL at 107.  HDL is 50.  Triglycerides normal at 112.  Total cholesterol 178. She has gained 7 pounds since September. Planning a trip to [place] with grandchildren. She and her husband should absolutely wear masks there. She has had 3 Covid-19 vaccines, flu vaccine and pneumococcal vaccines.    Review of Systems see above.     Objective:   Physical Exam  Blood pressure 102/70, pulse 72 regular pulse oximetry 96% weight 153 pounds BMI 25.86  Neck is supple without JVD thyromegaly or carotid bruits.  Chest is clear to auscultation.  Cardiac exam regular rate and rhythm normal S1 and S2.  Affect thought and judgment are normal.  No lower extremity edema.      Assessment & Plan:  Impaired glucose tolerance treated with diet only  Mild elevation of LDL treated with diet  Weight gain of 7 pounds since September.  She will work on diet exercise and weight loss.  We are not going to place her on any prescription medications at this point time.  Reevaluate in 6 months.  She will be due for health maintenance exam and Medicare wellness visit at that time.

## 2021-06-25 ENCOUNTER — Telehealth: Payer: PPO | Admitting: Internal Medicine

## 2021-06-25 DIAGNOSIS — Z23 Encounter for immunization: Secondary | ICD-10-CM | POA: Diagnosis not present

## 2021-06-25 NOTE — Telephone Encounter (Signed)
LVM that Dr Renold Genta said she would go ahead and get booster now. CB if you have any questions.

## 2021-06-25 NOTE — Telephone Encounter (Signed)
Natalie [name] [phone]  Raeden called to see what you thought about her going ahead and getting booster now or waiting till the new one comes out. She ig going to start going to [employer] starting on September 10.

## 2021-07-10 ENCOUNTER — Other Ambulatory Visit: Payer: PPO | Admitting: Internal Medicine

## 2021-07-10 ENCOUNTER — Other Ambulatory Visit: Payer: Self-pay

## 2021-07-10 DIAGNOSIS — Z0000 Encounter for general adult medical examination without abnormal findings: Secondary | ICD-10-CM

## 2021-07-10 DIAGNOSIS — R7302 Impaired glucose tolerance (oral): Secondary | ICD-10-CM

## 2021-07-10 DIAGNOSIS — E785 Hyperlipidemia, unspecified: Secondary | ICD-10-CM | POA: Diagnosis not present

## 2021-07-10 DIAGNOSIS — Z1329 Encounter for screening for other suspected endocrine disorder: Secondary | ICD-10-CM | POA: Diagnosis not present

## 2021-07-11 LAB — COMPLETE METABOLIC PANEL WITH GFR
AG Ratio: 1.6 (calc) (ref 1.0–2.5)
ALT: 18 U/L (ref 6–29)
AST: 22 U/L (ref 10–35)
Albumin: 4.4 g/dL (ref 3.6–5.1)
Alkaline phosphatase (APISO): 57 U/L (ref 37–153)
BUN: 23 mg/dL (ref 7–25)
CO2: 27 mmol/L (ref 20–32)
Calcium: 9.7 mg/dL (ref 8.6–10.4)
Chloride: 108 mmol/L (ref 98–110)
Creat: 0.72 mg/dL (ref 0.60–1.00)
Globulin: 2.7 g/dL (calc) (ref 1.9–3.7)
Glucose, Bld: 99 mg/dL (ref 65–99)
Potassium: 4.4 mmol/L (ref 3.5–5.3)
Sodium: 142 mmol/L (ref 135–146)
Total Bilirubin: 0.6 mg/dL (ref 0.2–1.2)
Total Protein: 7.1 g/dL (ref 6.1–8.1)
eGFR: 88 mL/min/{1.73_m2} (ref >=60)

## 2021-07-11 LAB — LIPID PANEL
Cholesterol: 203 mg/dL — ABNORMAL HIGH (ref <200)
HDL: 49 mg/dL — ABNORMAL LOW (ref >=50)
LDL Cholesterol (Calc): 127 mg/dL (calc) — ABNORMAL HIGH
Non-HDL Cholesterol (Calc): 154 mg/dL (calc) — ABNORMAL HIGH (ref <130)
Total CHOL/HDL Ratio: 4.1 (calc) (ref <5.0)
Triglycerides: 156 mg/dL — ABNORMAL HIGH (ref <150)

## 2021-07-11 LAB — CBC WITH DIFFERENTIAL/PLATELET
Absolute Monocytes: 546 cells/uL (ref 200–950)
Basophils Absolute: 48 cells/uL (ref 0–200)
Basophils Relative: 0.9 %
Eosinophils Absolute: 159 cells/uL (ref 15–500)
Eosinophils Relative: 3 %
HCT: 41.7 % (ref 35.0–45.0)
Hemoglobin: 13.6 g/dL (ref 11.7–15.5)
Lymphs Abs: 1685 cells/uL (ref 850–3900)
MCH: 29.1 pg (ref 27.0–33.0)
MCHC: 32.6 g/dL (ref 32.0–36.0)
MCV: 89.3 fL (ref 80.0–100.0)
MPV: 10.8 fL (ref 7.5–12.5)
Monocytes Relative: 10.3 %
Neutro Abs: 2862 cells/uL (ref 1500–7800)
Neutrophils Relative %: 54 %
Platelets: 177 10*3/uL (ref 140–400)
RBC: 4.67 10*6/uL (ref 3.80–5.10)
RDW: 13.1 % (ref 11.0–15.0)
Total Lymphocyte: 31.8 %
WBC: 5.3 10*3/uL (ref 3.8–10.8)

## 2021-07-11 LAB — TSH: TSH: 2.31 mIU/L (ref 0.40–4.50)

## 2021-07-11 LAB — HEMOGLOBIN A1C
Hgb A1c MFr Bld: 5.6 % of total Hgb (ref <5.7)
Mean Plasma Glucose: 114 mg/dL
eAG (mmol/L): 6.3 mmol/L

## 2021-07-13 ENCOUNTER — Encounter: Payer: Self-pay | Admitting: Internal Medicine

## 2021-07-13 ENCOUNTER — Other Ambulatory Visit: Payer: Self-pay

## 2021-07-13 ENCOUNTER — Ambulatory Visit (INDEPENDENT_AMBULATORY_CARE_PROVIDER_SITE_OTHER): Payer: PPO | Admitting: Internal Medicine

## 2021-07-13 VITALS — BP 108/60 | HR 67 | Ht 64.5 in | Wt 156.0 lb

## 2021-07-13 DIAGNOSIS — R7302 Impaired glucose tolerance (oral): Secondary | ICD-10-CM | POA: Diagnosis not present

## 2021-07-13 DIAGNOSIS — Z23 Encounter for immunization: Secondary | ICD-10-CM | POA: Diagnosis not present

## 2021-07-13 DIAGNOSIS — E782 Mixed hyperlipidemia: Secondary | ICD-10-CM | POA: Diagnosis not present

## 2021-07-13 DIAGNOSIS — Z8659 Personal history of other mental and behavioral disorders: Secondary | ICD-10-CM | POA: Diagnosis not present

## 2021-07-13 DIAGNOSIS — Z0000 Encounter for general adult medical examination without abnormal findings: Secondary | ICD-10-CM

## 2021-07-13 NOTE — Progress Notes (Addendum)
Subjective:    Patient ID: Natalie [name], female    DOB: [birth year], 75 y.o.   MRN: [id number]  HPI 75 year old Female for [employer], health maintenance exam, and evaluation of medical issues.  She has a previous history of Diabetes mellitus treated with Januvia.  She did well with  diet and exercise and now no longer requires Januvia.  Diabetes was diagnosed July 2016 after presenting with urinary frequency.  At the time of her initial presentation hemoglobin A1c was 13.4% and is now normal at 5.6%.  With regard to her lipids, her LDL has increased from 107 in March 2022 to 127.  Triglycerides are elevated from 112 to 156.  Total cholesterol has increased from 178 to 203.  Past medical history: History of anxiety related to airplane flight.  History of fractured foot secondary to a fall in 1986.  History of right shoulder adhesive capsulitis November 2010.  History of vaginal hysterectomy with enterocele and rectocele repair 2004.  Had colonoscopy in May 2012 showing hyperplastic polyps.  Has not had repeat study.  She was hospitalized in 2012 with a bout of gastroenteritis resulting in partial small bowel obstruction.  Was treated with IV fluids, bowel rest and subsequently obstruction resolved.  Social history: She is married.  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  She does not smoke.  Social alcohol consumption.  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  She has not worked outside the home since 1996.  2 adult children.  Husband has heart issues.  They enjoy traveling and going to [place].  Family history: Father died at age 79 due to his diffuse alveolar damage of the lung.  Mother died at age 11 with complications of pneumonia.  1 sister in good health.  Flu vaccine given  Review of Systems Had colonoscopy 10 years ago and repeat study is due.  Recently had Covid booster     Objective:   Physical Exam Blood pressure 108/60 pulse 67 pulse  oximetry 97% weight 156 pounds height 5 feet 4.5 inches BMI 26.36  Skin: Warm and dry.  Nodes none.  TMs clear.  Neck supple without JVD thyromegaly or carotid bruits.  Chest is clear.  Cardiac exam: Regular rate and rhythm.  Breasts are without masses.  Abdomen soft nondistended without hepatosplenomegaly masses or tenderness.  No lower extremity pitting edema.  Pap deferred with history of vaginal hysterectomy.  Bimanual is normal.  Affect thought and judgment are normal.       Assessment & Plan:  History of diabetes but subsequently able to come off diabetic medication with diet and exercise efforts.  Hemoglobin A1c stable  Hyperlipidemia-she does not want to be on statin therapy and will watch her diet a bit more with follow-up in 6 months.  History of anxiety particularly with airplane flight  Need for flu vaccine-this was given  Plan: Follow-up in 6 months with hemoglobin A1c and fasting lipid panel along with office visit.  Had COVID booster in August.  Tetanus immunization is up-to-date.  Has had vaccines for Pneumococcal disease. Subjective:   Patient presents for Medicare Annual/Subsequent preventive examination.  Review Past Medical/Family/Social:   Risk Factors  Current exercise habits: Light exercise regularly Dietary issues discussed: Yes needs to watch diet  Cardiac risk factors: History of impaired glucose tolerance, hyperlipidemia  Depression Screen  (Note: if answer to either of the following is "Yes", a more complete depression screening is indicated)   Over  the past two weeks, have you felt down, depressed or hopeless? No  Over the past two weeks, have you felt little interest or pleasure in doing things? No Have you lost interest or pleasure in daily life? No Do you often feel hopeless? No Do you cry easily over simple problems? No  Activities of Daily Living  In your present state of health, do you have any difficulty performing the following activities?:    Driving? No Managing money? No Feeding yourself? No Getting from bed to chair? No Climbing a flight of stairs? No Preparing food and eating?: No Bathing or showering? No Getting dressed: No Getting to the toilet? No Using the toilet: No Moving around from place to place:  No In the past year have you fallen or had a near fall?: No Are you sexually active? No Do you have more than one partner? No  Hearing Difficulties: No Do you often ask people to speak up or repeat themselves? No Do you experience ringing or noises in your ears? No Do you have difficulty understanding soft or whispered voices? No Do you feel that you have a problem with memory? No Do you often misplace items? No   Home Safety:  Do you have a smoke alarm at your residence? Yes Do you have grab bars in the bathroom? No Do you have throw rugs in your house? No   Cognitive Testing  Alert? Yes Normal Appearance?Yes  Oriented to person? Yes Place? Yes  Time? Yes  Recall of three objects? Yes  Can perform simple calculations? Yes  Displays appropriate judgment?Yes  Can read the correct time from a watch face?Yes   List the Names of Other Physician/Practitioners you currently use:  See referral list for the physicians patient is currently seeing.     Review of Systems:   Objective:     General appearance: Appears stated age and mildly obese  Head: Normocephalic, without obvious abnormality, atraumatic  Eyes: conj clear, EOMi PEERLA  Ears: normal TM's and external ear canals both ears  Nose: Nares normal. Septum midline. Mucosa normal. No drainage or sinus tenderness.  Throat: lips, mucosa, and tongue normal; teeth and gums normal  Neck: no adenopathy, no carotid bruit, no JVD, supple, symmetrical, trachea midline and thyroid not enlarged, symmetric, no tenderness/mass/nodules  No CVA tenderness.  Lungs: clear to auscultation bilaterally  Breasts: normal appearance, no masses or tenderness, top  of the pacemaker on left upper chest.  Heart: regular rate and rhythm, S1, S2 normal, no murmur, click, rub or gallop  Abdomen: soft, non-tender; bowel sounds normal; no masses, no organomegaly  Musculoskeletal: ROM normal in all joints, no crepitus, no deformity, Normal muscle strengthen. Back  is symmetric, no curvature. Skin: Skin color, texture, turgor normal. No rashes or lesions  Lymph nodes: Cervical, supraclavicular, and axillary nodes normal.  Neurologic: CN 2 -12 Normal, Normal symmetric reflexes. Normal coordination and gait  Psych: Alert & Oriented x 3, Mood appear stable.    Assessment:    Annual wellness medicare exam   Plan:    During the course of the visit the patient was educated and counseled about appropriate screening and preventive services including:   Repeat colonoscopy due.  She will call [facility]  Mammogram due in 2021  Flu vaccine given   Has recently had COVID-vaccine     Patient Instructions (the written plan) was given to the patient.  Medicare Attestation  I have personally reviewed:  The patient's medical and social history  Their use of alcohol, tobacco or illicit drugs  Their current medications and supplements  The patient's functional ability including ADLs,fall risks, home safety risks, cognitive, and hearing and visual impairment  Diet and physical activities  Evidence for depression or mood disorders  The patient's weight, height, BMI, and visual acuity have been recorded in the chart. I have made referrals, counseling, and provided education to the patient based on review of the above and I have provided the patient with a written personalized care plan for preventive services.

## 2021-07-13 NOTE — Patient Instructions (Addendum)
It was a pleasure to see you today. Had Covid booster in January since you were just vaccinated. Colonoscopy due. Call [facility] for appt. Flu vaccine today. RTC in 6 months. Work on diet and exercise.

## 2021-09-17 ENCOUNTER — Other Ambulatory Visit: Payer: Self-pay | Admitting: Internal Medicine

## 2021-09-17 DIAGNOSIS — Z1231 Encounter for screening mammogram for malignant neoplasm of breast: Secondary | ICD-10-CM

## 2021-10-08 ENCOUNTER — Ambulatory Visit (INDEPENDENT_AMBULATORY_CARE_PROVIDER_SITE_OTHER): Payer: PPO | Admitting: Internal Medicine

## 2021-10-08 ENCOUNTER — Encounter: Payer: Self-pay | Admitting: Internal Medicine

## 2021-10-08 ENCOUNTER — Other Ambulatory Visit: Payer: Self-pay

## 2021-10-08 ENCOUNTER — Telehealth: Payer: Self-pay | Admitting: Internal Medicine

## 2021-10-08 VITALS — HR 86 | Temp 99.6°F

## 2021-10-08 DIAGNOSIS — J069 Acute upper respiratory infection, unspecified: Secondary | ICD-10-CM | POA: Diagnosis not present

## 2021-10-08 DIAGNOSIS — N760 Acute vaginitis: Secondary | ICD-10-CM | POA: Diagnosis not present

## 2021-10-08 DIAGNOSIS — R059 Cough, unspecified: Secondary | ICD-10-CM | POA: Diagnosis not present

## 2021-10-08 DIAGNOSIS — R7302 Impaired glucose tolerance (oral): Secondary | ICD-10-CM | POA: Diagnosis not present

## 2021-10-08 DIAGNOSIS — J029 Acute pharyngitis, unspecified: Secondary | ICD-10-CM

## 2021-10-08 LAB — POCT INFLUENZA A/B
Influenza A, POC: NEGATIVE
Influenza B, POC: NEGATIVE

## 2021-10-08 LAB — POCT RAPID STREP A (OFFICE): Rapid Strep A Screen: NEGATIVE

## 2021-10-08 LAB — POC COVID19 BINAXNOW: SARS Coronavirus 2 Ag: NEGATIVE

## 2021-10-08 MED ORDER — AZITHROMYCIN 250 MG PO TABS: ORAL_TABLET | ORAL | 0 refills | Status: AC

## 2021-10-08 MED ORDER — FLUCONAZOLE 150 MG PO TABS: 150.0000 mg | ORAL_TABLET | Freq: Once | ORAL | 0 refills | Status: AC

## 2021-10-08 NOTE — Telephone Encounter (Signed)
Jamelah Sitzer [phone]  Veola called to say she has sore throat, cough, runny nose, no fever, does not feel bad, grandchildren are coming in over next week and husband is returning home Sunday. She would like a car visit to make sure she does not have anything they can catch. I ask her to do home COVID test and she said her have expired. I one of them you are to use your phone, she does not know how to do that, 4 COVID vaccines, Flu shot

## 2021-10-08 NOTE — Telephone Encounter (Signed)
Natalie [name] called back to say her home COVID test was Negative

## 2021-10-08 NOTE — Telephone Encounter (Signed)
scheduled

## 2021-10-12 ENCOUNTER — Telehealth: Payer: Self-pay

## 2021-10-12 LAB — TIQ-NTM

## 2021-10-12 LAB — RESPIRATORY VIRUS PANEL

## 2021-10-12 LAB — SARS-COV-2 RNA,(COVID-19) QUALITATIVE NAAT: SARS CoV2 RNA: NOT DETECTED

## 2021-10-12 NOTE — Telephone Encounter (Signed)
Patient called check on testing. She has been advised that all were negative. She states that today is her last day of zpack. She states that the is a little cough and runny nose but much better. She asked if she needed more medication, she has been advised it continues to work 7-10 days after taking last pill. She will call us if she begins to fell bad again. She agrees with plan.

## 2021-10-15 DIAGNOSIS — L579 Skin changes due to chronic exposure to nonionizing radiation, unspecified: Secondary | ICD-10-CM | POA: Diagnosis not present

## 2021-10-15 DIAGNOSIS — L570 Actinic keratosis: Secondary | ICD-10-CM | POA: Diagnosis not present

## 2021-10-15 DIAGNOSIS — L814 Other melanin hyperpigmentation: Secondary | ICD-10-CM | POA: Diagnosis not present

## 2021-10-15 DIAGNOSIS — L821 Other seborrheic keratosis: Secondary | ICD-10-CM | POA: Diagnosis not present

## 2021-10-15 DIAGNOSIS — D225 Melanocytic nevi of trunk: Secondary | ICD-10-CM | POA: Diagnosis not present

## 2021-10-15 DIAGNOSIS — L249 Irritant contact dermatitis, unspecified cause: Secondary | ICD-10-CM | POA: Diagnosis not present

## 2021-10-27 ENCOUNTER — Ambulatory Visit
Admission: RE | Admit: 2021-10-27 | Discharge: 2021-10-27 | Disposition: A | Discharge status: Home / Self Care | Payer: PPO | Source: Ambulatory Visit | Attending: Internal Medicine | Admitting: Internal Medicine

## 2021-10-27 DIAGNOSIS — Z1231 Encounter for screening mammogram for malignant neoplasm of breast: Secondary | ICD-10-CM

## 2021-11-02 NOTE — Progress Notes (Signed)
° °  Subjective:    Patient ID: Natalie [name], female    DOB: [birth year], 76 y.o.   MRN: [id number]  HPI 76 year old Female seen with sore throat, cough, runny nose.  Does not have malaise.  Grandchildren are coming to visit in the next few days and husband who is currently out of town and is returning home on Sunday, December 11.  Patient requesting car visit to rule out contagious illness.  She has had 4 COVID vaccines and has already had annual flu vaccine.  Since her COVID test at home have expired.  She has a history of impaired glucose tolerance and mild hyperlipidemia.     Review of Systems see above-no shaking chills, nausea vomiting or headache     Objective:   Physical Exam Temperature 99.6 degrees pulse oximetry 97% pulse 86  Skin: Warm and dry.  Pharynx slightly injected without exudate.  TMs clear.  Neck is supple.  Chest clear to auscultation.  Rapid strep screen is negative.  Rapid influenza a/B testing is negative.  Point-of-care COVID-19 by next test is also negative.     Assessment & Plan:  Acute upper respiratory infection-likely viral such as rhinovirus.  Does not have strep throat, flu or COVID-19.  Plan: Zithromax Z-PAK 2 tabs day 1 followed by 1 tab days 2 through 5.  Diflucan 150 mg tablet should patient develop Candida vaginitis while on antibiotics.  Rest and drink fluids.  Call if not better in a few days or sooner if worse.

## 2021-11-23 NOTE — Patient Instructions (Signed)
Take Zithromax Z-PAK 2 tabs day 1 followed by 1 tab days 2 through 5.  Rest and drink fluids.  Call if not better in a few days or sooner if worse.  May take Diflucan 150 mg tablet if you develop Candida vaginitis while on antibiotics.  We are sorry you are not not feeling well

## 2022-01-07 ENCOUNTER — Other Ambulatory Visit: Payer: Self-pay

## 2022-01-07 ENCOUNTER — Other Ambulatory Visit: Payer: PPO | Admitting: Internal Medicine

## 2022-01-07 DIAGNOSIS — F419 Anxiety disorder, unspecified: Secondary | ICD-10-CM

## 2022-01-07 DIAGNOSIS — R7302 Impaired glucose tolerance (oral): Secondary | ICD-10-CM | POA: Diagnosis not present

## 2022-01-07 DIAGNOSIS — R5383 Other fatigue: Secondary | ICD-10-CM

## 2022-01-07 DIAGNOSIS — E782 Mixed hyperlipidemia: Secondary | ICD-10-CM | POA: Diagnosis not present

## 2022-01-08 LAB — LIPID PANEL
Cholesterol: 177 mg/dL (ref <200)
HDL: 47 mg/dL — ABNORMAL LOW (ref >=50)
LDL Cholesterol (Calc): 104 mg/dL (calc) — ABNORMAL HIGH
Non-HDL Cholesterol (Calc): 130 mg/dL (calc) — ABNORMAL HIGH (ref <130)
Total CHOL/HDL Ratio: 3.8 (calc) (ref <5.0)
Triglycerides: 143 mg/dL (ref <150)

## 2022-01-08 LAB — HEMOGLOBIN A1C
Hgb A1c MFr Bld: 5.8 % of total Hgb — ABNORMAL HIGH (ref <5.7)
Mean Plasma Glucose: 120 mg/dL
eAG (mmol/L): 6.6 mmol/L

## 2022-01-11 ENCOUNTER — Other Ambulatory Visit: Payer: PPO | Admitting: Internal Medicine

## 2022-01-11 ENCOUNTER — Encounter: Payer: Self-pay | Admitting: Internal Medicine

## 2022-01-11 ENCOUNTER — Ambulatory Visit (INDEPENDENT_AMBULATORY_CARE_PROVIDER_SITE_OTHER): Payer: PPO | Admitting: Internal Medicine

## 2022-01-11 ENCOUNTER — Other Ambulatory Visit: Payer: Self-pay

## 2022-01-11 VITALS — BP 100/78 | HR 64 | Temp 97.8°F | Ht 64.5 in | Wt 160.0 lb

## 2022-01-11 DIAGNOSIS — R7302 Impaired glucose tolerance (oral): Secondary | ICD-10-CM | POA: Diagnosis not present

## 2022-01-11 DIAGNOSIS — Z8659 Personal history of other mental and behavioral disorders: Secondary | ICD-10-CM

## 2022-01-11 DIAGNOSIS — E782 Mixed hyperlipidemia: Secondary | ICD-10-CM | POA: Diagnosis not present

## 2022-01-11 DIAGNOSIS — Z6827 Body mass index (BMI) 27.0-27.9, adult: Secondary | ICD-10-CM

## 2022-01-11 MED ORDER — ALPRAZOLAM 0.5 MG PO TABS: ORAL_TABLET | ORAL | 0 refills | Status: DC

## 2022-01-11 NOTE — Progress Notes (Unsigned)
° °  Subjective:    Patient ID: Natalie [name], female    DOB: [birth year], 76 y.o.   MRN: [id number]  HPI 76 year old Female    Review of Systems  reminded needs diabetic eye exam. Has colonoscopy scheduled in May.     Objective:   Physical Exam        Assessment & Plan:

## 2022-01-11 NOTE — Patient Instructions (Signed)
Xanax 0.5 mg for airplane travel

## 2022-03-18 DIAGNOSIS — Z1211 Encounter for screening for malignant neoplasm of colon: Secondary | ICD-10-CM | POA: Diagnosis not present

## 2022-03-18 DIAGNOSIS — K5730 Diverticulosis of large intestine without perforation or abscess without bleeding: Secondary | ICD-10-CM | POA: Diagnosis not present

## 2022-03-18 DIAGNOSIS — K649 Unspecified hemorrhoids: Secondary | ICD-10-CM | POA: Diagnosis not present

## 2022-03-18 DIAGNOSIS — K5520 Angiodysplasia of colon without hemorrhage: Secondary | ICD-10-CM | POA: Diagnosis not present

## 2022-05-11 DIAGNOSIS — L821 Other seborrheic keratosis: Secondary | ICD-10-CM | POA: Diagnosis not present

## 2022-05-11 DIAGNOSIS — L820 Inflamed seborrheic keratosis: Secondary | ICD-10-CM | POA: Diagnosis not present

## 2022-05-11 DIAGNOSIS — L720 Epidermal cyst: Secondary | ICD-10-CM | POA: Diagnosis not present

## 2022-07-16 ENCOUNTER — Other Ambulatory Visit: Payer: PPO

## 2022-07-16 DIAGNOSIS — F419 Anxiety disorder, unspecified: Secondary | ICD-10-CM | POA: Diagnosis not present

## 2022-07-16 DIAGNOSIS — R5383 Other fatigue: Secondary | ICD-10-CM | POA: Diagnosis not present

## 2022-07-16 DIAGNOSIS — R7302 Impaired glucose tolerance (oral): Secondary | ICD-10-CM

## 2022-07-16 DIAGNOSIS — E7800 Pure hypercholesterolemia, unspecified: Secondary | ICD-10-CM

## 2022-07-16 DIAGNOSIS — E782 Mixed hyperlipidemia: Secondary | ICD-10-CM | POA: Diagnosis not present

## 2022-07-17 LAB — CBC WITH DIFFERENTIAL/PLATELET
Absolute Monocytes: 564 cells/uL (ref 200–950)
Basophils Absolute: 71 cells/uL (ref 0–200)
Basophils Relative: 1.5 %
Eosinophils Absolute: 230 cells/uL (ref 15–500)
Eosinophils Relative: 4.9 %
HCT: 41.6 % (ref 35.0–45.0)
Hemoglobin: 14.1 g/dL (ref 11.7–15.5)
Lymphs Abs: 1560 cells/uL (ref 850–3900)
MCH: 30.3 pg (ref 27.0–33.0)
MCHC: 33.9 g/dL (ref 32.0–36.0)
MCV: 89.3 fL (ref 80.0–100.0)
MPV: 10.3 fL (ref 7.5–12.5)
Monocytes Relative: 12 %
Neutro Abs: 2275 cells/uL (ref 1500–7800)
Neutrophils Relative %: 48.4 %
Platelets: 179 10*3/uL (ref 140–400)
RBC: 4.66 10*6/uL (ref 3.80–5.10)
RDW: 13 % (ref 11.0–15.0)
Total Lymphocyte: 33.2 %
WBC: 4.7 10*3/uL (ref 3.8–10.8)

## 2022-07-17 LAB — LIPID PANEL
Cholesterol: 172 mg/dL (ref <200)
HDL: 46 mg/dL — ABNORMAL LOW (ref >=50)
LDL Cholesterol (Calc): 102 mg/dL (calc) — ABNORMAL HIGH
Non-HDL Cholesterol (Calc): 126 mg/dL (calc) (ref <130)
Total CHOL/HDL Ratio: 3.7 (calc) (ref <5.0)
Triglycerides: 142 mg/dL (ref <150)

## 2022-07-17 LAB — COMPLETE METABOLIC PANEL WITH GFR
AG Ratio: 1.7 (calc) (ref 1.0–2.5)
ALT: 19 U/L (ref 6–29)
AST: 26 U/L (ref 10–35)
Albumin: 4.5 g/dL (ref 3.6–5.1)
Alkaline phosphatase (APISO): 57 U/L (ref 37–153)
BUN: 18 mg/dL (ref 7–25)
CO2: 27 mmol/L (ref 20–32)
Calcium: 9.5 mg/dL (ref 8.6–10.4)
Chloride: 108 mmol/L (ref 98–110)
Creat: 0.68 mg/dL (ref 0.60–1.00)
Globulin: 2.7 g/dL (calc) (ref 1.9–3.7)
Glucose, Bld: 103 mg/dL — ABNORMAL HIGH (ref 65–99)
Potassium: 4.3 mmol/L (ref 3.5–5.3)
Sodium: 142 mmol/L (ref 135–146)
Total Bilirubin: 0.5 mg/dL (ref 0.2–1.2)
Total Protein: 7.2 g/dL (ref 6.1–8.1)
eGFR: 91 mL/min/{1.73_m2} (ref >=60)

## 2022-07-17 LAB — HEMOGLOBIN A1C
Hgb A1c MFr Bld: 5.7 % of total Hgb — ABNORMAL HIGH (ref <5.7)
Mean Plasma Glucose: 117 mg/dL
eAG (mmol/L): 6.5 mmol/L

## 2022-07-17 LAB — TSH: TSH: 2.09 mIU/L (ref 0.40–4.50)

## 2022-07-19 ENCOUNTER — Encounter: Payer: Self-pay | Admitting: Internal Medicine

## 2022-07-19 ENCOUNTER — Ambulatory Visit (INDEPENDENT_AMBULATORY_CARE_PROVIDER_SITE_OTHER): Payer: PPO | Admitting: Internal Medicine

## 2022-07-19 VITALS — BP 106/70 | HR 64 | Temp 98.9°F | Ht 64.5 in | Wt 159.0 lb

## 2022-07-19 DIAGNOSIS — R7302 Impaired glucose tolerance (oral): Secondary | ICD-10-CM | POA: Diagnosis not present

## 2022-07-19 DIAGNOSIS — Z23 Encounter for immunization: Secondary | ICD-10-CM | POA: Diagnosis not present

## 2022-07-19 DIAGNOSIS — Z8659 Personal history of other mental and behavioral disorders: Secondary | ICD-10-CM

## 2022-07-19 DIAGNOSIS — Z0000 Encounter for general adult medical examination without abnormal findings: Secondary | ICD-10-CM

## 2022-07-19 LAB — POCT URINALYSIS DIPSTICK
Bilirubin, UA: NEGATIVE
Glucose, UA: NEGATIVE
Ketones, UA: NEGATIVE
Leukocytes, UA: NEGATIVE
Nitrite, UA: NEGATIVE
Protein, UA: NEGATIVE
Spec Grav, UA: 1.015 (ref 1.010–1.025)
Urobilinogen, UA: 0.2 E.U./dL
pH, UA: 5 (ref 5.0–8.0)

## 2022-07-19 NOTE — Patient Instructions (Signed)
It was a pleasure to see you today. RTC in 6 months. Flu vaccine given. Other vaccines discussed. Labs reviewed and are stable.

## 2022-07-19 NOTE — Progress Notes (Signed)
Annual Wellness Visit     Patient: Natalie [name], Female    DOB: [birth year], 76 y.o.   MRN: [id number] Visit Date: 07/19/2022  Chief Complaint  Patient presents with   Medicare Wellness   Annual Exam   Subjective    Natalie [name] is a 76 y.o. female who presents today for her Annual Wellness Visit.  She also presents for health maintenance exam and evaluation of medical issues.  HPI Diabetes was diagnosed in July 2016 after presenting with urinary frequency.  At the time of her initial presentation her hemoglobin was 13.4%.  She did well with diet and exercise and no longer requires Januvia.  Hemoglobin A1c is excellent at 5.7%.  Fasting glucose is 103.  Lipids are excellent as well.  She does have a history of low HDL at 46.  LDL is 102.  Total cholesterol 172 and triglycerides are normal at 142.  She prefers to manage this with diet and not take statin medication.  She recently had CT coronary calcium scoring and chest CT.  Chest CT was normal.  She had an excellent coronary calcium score of 113.  She is a candidate for low-dose statin with this score since she is over 35 years of age.  However she does not want to take it.  She had mammogram in December 2022 that was normal he does have history of osteoporosis in the right femoral neck with bone density study in 2019-3.1.  Has not wanted to be on Prolia or Boniva.  She was hospitalized in 2012 with a bout of gastroenteritis resulting in partial small bowel obstruction.  Was treated with IV fluids, bowel rest and subsequently obstruction resolved.  Past medical history includes anxiety related to airplane flight.  History of fractured foot secondary to a fall in 1986.  Right shoulder adhesive capsulitis November 2010.  History of vaginal hysterectomy with enterocele and rectocele repair in 2004.  Had colonoscopy in May 2012 showing hyperplastic polyps and has not had repeat study.  Social history: She is  married.  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  She does not smoke.  Social alcohol consumption.  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  She has not worked outside the home since 1996.  2 adult children.  Husband has cardiac issues.  They enjoy traveling and going to [place].  Family history: Father died at age 51 due to the diffuse alveolar damage of the lung.  Mother died at age 42 with complications of pneumonia.  1 sister in good health.  Vaccines discussed.  She needs to consider tetanus update.  Needs to consider pneumococcal 20, Shingrix vaccine, RSV vaccine since she has grandchildren.  Recent need for flu vaccine today.        Review of Systems   Objective    Vitals: BP 106/70   Pulse 64   Temp 98.9 F (37.2 C) (Tympanic)   Ht 5' 4.5" (1.638 m)   Wt 159 lb (72.1 kg)   SpO2 98%   BMI 26.87 kg/m   Physical Exam: Skin: Warm and dry, nodes none.  TMs clear.  Pharynx is clear.  Neck is supple.  No carotid bruits.  No thyromegaly.  No adenopathy.  Chest clear.  Cardiac exam: Regular rate and rhythm without ectopy or murmurs.  Abdomen is soft, nondistended without hepatosplenomegaly masses or tenderness.  No lower extremity pitting edema.  Neurological exam is intact without gross focal deficits.   Most  recent functional status assessment:    07/19/2022   10:04 AM  In your present state of health, do you have any difficulty performing the following activities:  Hearing? 0  Vision? 0  Difficulty concentrating or making decisions? 0  Dressing or bathing? 0  Doing errands, shopping? 0  Preparing Food and eating ? N  Using the Toilet? N  In the past six months, have you accidently leaked urine? N  Do you have problems with loss of bowel control? N  Managing your Medications? N  Managing your Finances? N  Housekeeping or managing your Housekeeping? N   Most recent fall risk assessment:    07/19/2022   10:04 AM  Fall Risk   Falls in the past  year? 0  Number falls in past yr: 0  Injury with Fall? 0  Risk for fall due to : No Fall Risks  Follow up Falls evaluation completed    Most recent depression screenings:    07/19/2022   10:04 AM 01/22/2021   12:18 PM  PHQ 2/9 Scores  PHQ - 2 Score 0 0   Most recent cognitive screening:    07/19/2022   10:05 AM  6CIT Screen  What Year? 0 points  What month? 0 points  What time? 0 points  Count back from 20 0 points  Months in reverse 0 points  Repeat phrase 0 points  Total Score 0 points       Assessment & Plan   Hyperlipidemia-does not want to be on statin medication.  Follow-up in 6 months.  Coronary calcium score is good but she could consider a statin.  Her lipids are within normal limits basically except for a low HDL which is inherited and could be increased with more exercise.  History of anxiety-especially with airplane flight.  Has Xanax if needed.  History of diabetes but was able to do well with diet and exercise.  Hemoglobin A1c is stable at 5.7%.  Plan: I am pleased with her diabetic control and overall with her lipid control as well.  Her HDL really cannot be increased as it is mostly inherited but will increase some with exercise.  She will return in 6 months.        Annual wellness visit done today including the all of the following: Reviewed patient's Family Medical History Reviewed and updated list of patient's medical providers Assessment of cognitive impairment was done Assessed patient's functional ability Established a written schedule for health screening [place] Completed and Reviewed  Discussed health benefits of physical activity, and encouraged her to engage in regular exercise appropriate for her age and condition.         {I, Elby Showers, MD, have reviewed all documentation for this visit. The documentation on 07/19/22 for the exam, diagnosis, procedures, and orders are all accurate and complete.   LaVon  Barron Alvine, CMA

## 2022-07-20 ENCOUNTER — Encounter: Payer: Self-pay | Admitting: Internal Medicine

## 2022-07-22 ENCOUNTER — Ambulatory Visit: Payer: PPO | Admitting: Internal Medicine

## 2022-07-22 ENCOUNTER — Ambulatory Visit (HOSPITAL_BASED_OUTPATIENT_CLINIC_OR_DEPARTMENT_OTHER)
Admission: RE | Admit: 2022-07-22 | Discharge: 2022-07-22 | Disposition: A | Discharge status: Home / Self Care | Payer: PPO | Source: Ambulatory Visit | Attending: Internal Medicine | Admitting: Internal Medicine

## 2022-07-22 DIAGNOSIS — R7302 Impaired glucose tolerance (oral): Secondary | ICD-10-CM | POA: Insufficient documentation

## 2022-08-06 ENCOUNTER — Telehealth: Payer: Self-pay | Admitting: Internal Medicine

## 2022-08-06 DIAGNOSIS — H6981 Other specified disorders of Eustachian tube, right ear: Secondary | ICD-10-CM | POA: Diagnosis not present

## 2022-08-06 DIAGNOSIS — R220 Localized swelling, mass and lump, head: Secondary | ICD-10-CM | POA: Diagnosis not present

## 2022-08-06 NOTE — Telephone Encounter (Signed)
Natalie [name] [phone]  Natalie [name] called to say she has found a knot on her neck. I let her know you are out of office until  Tuesday, that she could go to urgent care. She stated she did not know what she would do, she would figure something out.

## 2022-08-11 NOTE — Telephone Encounter (Signed)
Called back to check on Natalie [name] and she had gone to [facility] at [facility] and they treated her with Amoxicillin and Sudfedogest. The place on her neck was like a swollen gland. It is getting better now. She will call office if she needs anything else.

## 2022-09-20 ENCOUNTER — Telehealth: Payer: Self-pay | Admitting: Internal Medicine

## 2022-09-20 ENCOUNTER — Encounter: Payer: Self-pay | Admitting: Internal Medicine

## 2022-09-20 ENCOUNTER — Ambulatory Visit (INDEPENDENT_AMBULATORY_CARE_PROVIDER_SITE_OTHER): Payer: PPO | Admitting: Internal Medicine

## 2022-09-20 VITALS — BP 122/80 | HR 67 | Temp 98.0°F

## 2022-09-20 DIAGNOSIS — J029 Acute pharyngitis, unspecified: Secondary | ICD-10-CM

## 2022-09-20 DIAGNOSIS — J069 Acute upper respiratory infection, unspecified: Secondary | ICD-10-CM

## 2022-09-20 DIAGNOSIS — H6503 Acute serous otitis media, bilateral: Secondary | ICD-10-CM

## 2022-09-20 LAB — POCT RAPID STREP A (OFFICE): Rapid Strep A Screen: NEGATIVE

## 2022-09-20 MED ORDER — AZITHROMYCIN 250 MG PO TABS: ORAL_TABLET | ORAL | 0 refills | Status: AC

## 2022-09-20 NOTE — Patient Instructions (Addendum)
Zithromax Z pack 2 tabs day 1 followed by 1 tab days 2 through 5.  Rest and stay well-hydrated. Tylenol if needed for sore throat pain. Rapid strep screen is negative.

## 2022-09-20 NOTE — Progress Notes (Signed)
   Subjective:    Patient ID: Natalie [name], female    DOB: 1946/04/16, 76 y.o.   MRN: [id number]  HPI Patient and her husband had a houseguest last week who apparently had a respiratory infection but told them he had allergies.  After he left, they  both came down with respiratory infections.  She has a history of impaired glucose tolerance that is currently controlled with diet.   Past medical history: History of anxiety related to airplane flight.  History of fractured foot secondary to a fall in 1986.  History of right shoulder adhesive capsulitis November 2010.  History of vaginal hysterectomy with enterocele and rectocele repair 2004.  Had colonoscopy in May 2012 showing hyperplastic polyps.  Had repeat study  in 2023 that was WNL and 10 year follow up advised. Hospitalized in 2012 with a bout of gastroenteritis resulting in partial small bowel obstruction treated with IV fluids, bowel rest and subsequently obstruction resolved.  Social history: She is married.  She is a Agricultural engineer.  Does not smoke.  Social alcohol consumption.  2 adult children and several grandchildren.  Family history: Father died at age 10 due to  diffuse alveolar damage of the lung.  Mother died at age 57 with complications of pneumonia.  1 sister in good health.  She has cough, sore throat,  rhinorrhea, and head congestion.  She had onset of symptoms yesterday.  She took a COVID test which was negative.  She plans on hosting a large Thanksgiving dinner  with family. She has to do some cooking.     Review of Systems no fever, chills nausea or vomiting. No headache.     Objective:   Physical Exam She is afebrile.  TMs slightly full bilaterally.  Pharynx slightly injected.  Rapid strep screen negative.  Neck is supple.  Chest clear to auscultation without rales or wheezing.       Assessment & Plan:  Acute pharyngitis- rapid strep screen is negative  Acute  bilateral serous otitis media- left greater  than right   Plan: Zithromax Z-PAK 2 tabs day 1 followed by 1 tab days 2 through 5. Rest and stay well hydrated.

## 2022-09-20 NOTE — Telephone Encounter (Signed)
[name] [phone]  Truman Hayward called to say that Natalie [name] is sick with what he thinks is probably same thing he has, she has Cough, runny nose, sore throat, head congestion, started Sunday. I scheduled her to be seen after him.

## 2022-09-27 ENCOUNTER — Other Ambulatory Visit: Payer: Self-pay | Admitting: Internal Medicine

## 2022-09-27 DIAGNOSIS — Z1231 Encounter for screening mammogram for malignant neoplasm of breast: Secondary | ICD-10-CM

## 2022-10-12 DIAGNOSIS — L579 Skin changes due to chronic exposure to nonionizing radiation, unspecified: Secondary | ICD-10-CM | POA: Diagnosis not present

## 2022-10-12 DIAGNOSIS — L814 Other melanin hyperpigmentation: Secondary | ICD-10-CM | POA: Diagnosis not present

## 2022-10-12 DIAGNOSIS — D2262 Melanocytic nevi of left upper limb, including shoulder: Secondary | ICD-10-CM | POA: Diagnosis not present

## 2022-10-12 DIAGNOSIS — L821 Other seborrheic keratosis: Secondary | ICD-10-CM | POA: Diagnosis not present

## 2022-10-27 ENCOUNTER — Telehealth: Payer: Self-pay | Admitting: Internal Medicine

## 2022-10-27 ENCOUNTER — Encounter: Payer: Self-pay | Admitting: Internal Medicine

## 2022-10-27 ENCOUNTER — Telehealth (INDEPENDENT_AMBULATORY_CARE_PROVIDER_SITE_OTHER): Payer: PPO | Admitting: Internal Medicine

## 2022-10-27 VITALS — BP 117/82 | Temp 98.7°F

## 2022-10-27 DIAGNOSIS — U071 COVID-19: Secondary | ICD-10-CM

## 2022-10-27 MED ORDER — HYDROCODONE BIT-HOMATROP MBR 5-1.5 MG/5ML PO SOLN: 5.0000 mL | Freq: Three times a day (TID) | ORAL | 0 refills | Status: DC | PRN

## 2022-10-27 MED ORDER — ONDANSETRON HCL 4 MG PO TABS: 4.0000 mg | ORAL_TABLET | Freq: Three times a day (TID) | ORAL | 0 refills | Status: DC | PRN

## 2022-10-27 MED ORDER — NIRMATRELVIR/RITONAVIR (PAXLOVID)TABLET: 3.0000 | ORAL_TABLET | Freq: Two times a day (BID) | ORAL | 0 refills | Status: AC

## 2022-10-27 NOTE — Progress Notes (Signed)
   Subjective:    Patient ID: Natalie [name], female    DOB: 1946/01/17, 76 y.o.   MRN: [id number]  HPI  76 year old Female seen today via interactive audio and video telecommunications.  She is identified using 2 identifiers as Natalie [name], a patient in this practice.  She is at her home and I am at my office.  She is agreeable to visit in this format today.  Patient developed scratchy throat on Monday, December 25 and had low-grade fever of 100.6 degrees.  She tested negative for COVID but this morning has tested positive for COVID.  She has no shortness of breath.  Pulse oximetry on room air lying in bed is 94%.  It might improve if she was up walking around.  She does not feel short of breath.  She has no nausea or vomiting but has malaise and fatigue.  Patient has not been vaccinated for COVID-19 this season.  She has had a flu vaccine in September.  Last COVID-vaccine according to our records is August 2022.  Husband is not ill.  They did go out and eat in [place] at [employer] recently.  Patient has a history of impaired glucose tolerance.  This is well controlled with diet and exercise.  She has a history of anxiety treated with Xanax.    Review of Systems no complaint of nausea, vomiting, diarrhea.     Objective:   Physical Exam  Patient is seen lying in bed.  She looks slightly pale.  She is able to give a clear concise history.  Does not sound tachypneic while speaking with her via video.  Not heard to be coughing at the present time.      Assessment & Plan:  Acute COVID-19 virus infection  Plan: Patient is agreeable to taking Paxlovid.  Prescription will be sent in for Paxlovid regular strength.  Her renal functions are normal.  She will also be sent in a prescription for Zofran 4 mg to take every 8 hours if needed for nausea and Hycodan 1 teaspoon every 8 hours if needed for cough.  She is to walk some about her home to prevent atelectasis and stay  well-hydrated.  May take Tylenol for fever.  Needs to quarantine at home for 5 days.  Call if symptoms worsen.  Time spent on this call is 25 minutes including chart review, interviewing patient, medical decision making and E scribing medications.

## 2022-10-27 NOTE — Telephone Encounter (Signed)
Natalie [name] called to say she tested positive for COVID this morning, she started out with scratchy throat Christmas night and now has a cough, and runny nose. Scheduled her for video visit at 10:30 AM

## 2022-10-27 NOTE — Patient Instructions (Addendum)
Paxlovid regular strength called to pharmacy.  Also sent in Zofran 4 mg tablets to take 1 every 8 hours if needed for nausea.  May take Hycodan 1 teaspoon every 8 hours as needed for cough.  Monitor pulse oximetry.  Call if pulse oximetry is running low i.e. less than 93%.  Best to check pulse oximetry when up and walking around.  Walk some to prevent atelectasis.  May take Tylenol for fever.  Stay well-hydrated.  Quarantine at home for 5 days.

## 2022-11-08 ENCOUNTER — Telehealth: Payer: Self-pay

## 2022-11-08 ENCOUNTER — Encounter: Payer: Self-pay | Admitting: Internal Medicine

## 2022-11-08 ENCOUNTER — Ambulatory Visit (INDEPENDENT_AMBULATORY_CARE_PROVIDER_SITE_OTHER): Payer: PPO | Admitting: Internal Medicine

## 2022-11-08 VITALS — BP 104/62 | HR 87 | Temp 99.3°F | Ht 64.5 in | Wt 165.8 lb

## 2022-11-08 DIAGNOSIS — M545 Low back pain, unspecified: Secondary | ICD-10-CM | POA: Diagnosis not present

## 2022-11-08 DIAGNOSIS — Z8659 Personal history of other mental and behavioral disorders: Secondary | ICD-10-CM | POA: Diagnosis not present

## 2022-11-08 DIAGNOSIS — R059 Cough, unspecified: Secondary | ICD-10-CM

## 2022-11-08 MED ORDER — MELOXICAM 15 MG PO TABS: 15.0000 mg | ORAL_TABLET | Freq: Every day | ORAL | 0 refills | Status: DC

## 2022-11-08 MED ORDER — CYCLOBENZAPRINE HCL 10 MG PO TABS: ORAL_TABLET | ORAL | 0 refills | Status: DC

## 2022-11-08 MED ORDER — HYDROCODONE-ACETAMINOPHEN 5-325 MG PO TABS: 1.0000 | ORAL_TABLET | Freq: Three times a day (TID) | ORAL | 0 refills | Status: DC | PRN

## 2022-11-08 NOTE — Telephone Encounter (Signed)
Patient called asking to be seen for back pain, patient is getting over Covid and still has a cough please advise

## 2022-11-08 NOTE — Patient Instructions (Addendum)
Patient is to take meloxicam 15 mg daily with a meal.  May take Flexeril 1/2 to 1 tablet up to 3 times daily as needed for muscle spasms and back pain.  Norco 5/325 to take sparingly with a meal up to 3 times daily every 8 hours as needed for acute severe low back pain.  Call if not improving within 48 hours.  May need to go to physical therapy.

## 2022-11-08 NOTE — Progress Notes (Signed)
   Subjective:    Patient ID: Natalie [name], female    DOB: 15-Nov-1945, 77 y.o.   MRN: [id number]  HPI   77 year old Female seen for acute back pain onset this past weekend after moving and lifting Christmas decorations from home to basement.  She did most of  these activities on Friday, January 5th. Now having severe back pain. It is even uncomfortable to move around in bed. Had Covid-19 in late December. Still has a persistent cough from having COVID-19.    Review of Systems pain is centralized in her lower back     Objective:   Physical Exam  She has considerable spasm in back and moves slowly.  Her muscle strength in the lower extremities is normal.  Sensation is intact in the lower extremities.  Deep tendon reflexes 2+ and symmetrical in the knees.  Her chest is clear to auscultation without rales or wheezing.     Assessment & Plan:   Acute low back pain with spasm  Recent COVID-19 virus infection  Anxiety state-the back pain has been unrelenting and has made her anxious  Plan: She is to take meloxicam 15 mg daily with food.  May take Flexeril 10 mg 1/2 to 1 tablet up to 3 times daily as needed for muscle spasms and back pain.  May take Norco 5/325 sparingly with a meal up to 3 times daily every 8 hours as needed for acute severe pain.  Call if not improving within 48 hours.  May need to go to physical therapy if does not improve with medication.  Advised not to go to [place] later this week as it is a long trip and may make her back issue worse.

## 2022-11-18 ENCOUNTER — Ambulatory Visit
Admission: RE | Admit: 2022-11-18 | Discharge: 2022-11-18 | Disposition: A | Discharge status: Home / Self Care | Payer: PPO | Source: Ambulatory Visit | Attending: Internal Medicine | Admitting: Internal Medicine

## 2022-11-18 DIAGNOSIS — Z1231 Encounter for screening mammogram for malignant neoplasm of breast: Secondary | ICD-10-CM

## 2023-01-13 NOTE — Progress Notes (Addendum)
Patient Care Team: Elby Showers, MD as PCP - General (Internal Medicine)  Visit Date: 01/20/23  Subjective:    Patient ID: Natalie [name] , Female   DOB: August 05, 1946, 77 y.o.    MRN: [id number]   76 y.o. Female presents today for a 6 month follow-up. Patient has a past medical history of allergic rhinitis, glucose intolerance. Hx of hyperlipidemia currently diet controlled. Had Covid-19 in December. Had acute Back pain in January.  Reports feeling well now regarding general health.  History of chronic back pain treated with Mobic 15 mg daily, Flexeril 5-10 mg three times daily as needed. Her back has not been bothering her. Has been having some left knee pain after leaning forward to hang some clothes in her car. Denies swelling in left knee, pain in ligaments, cartilage.   History of impaired glucose intolerance. HGBA1c at 6.3% on 01/18/23.This is being controlled by diet.  History of mixed hyperlipidemia. CHOL elevated at 206, LDL at 114, TRIG at 292 on 01/18/23. Has not been walking as much as she normally would due to knee pain. Has also been traveling recently.   Past Medical History:  Diagnosis Date   Allergic rhinitis    Diabetes mellitus without complication ([place])      Family History  Problem Relation Age of Onset   Lung disease Father        diffuse alveolar, following lung injury in MVA   Cancer Other    Breast cancer Maternal Grandmother     Social History   Social History Narrative   Regular exercise- no   Married with adult children, enjoys traveling    Review of Systems  Constitutional:  Negative for fever and malaise/fatigue.  HENT:  Negative for congestion.   Eyes:  Negative for blurred vision.  Respiratory:  Negative for cough and shortness of breath.   Cardiovascular:  Negative for chest pain, palpitations and leg swelling.  Gastrointestinal:  Negative for vomiting.  Musculoskeletal:  Positive for joint pain (Left knee). Negative for back  pain.       (-) Swelling in left knee (-) Pain in ligaments/cartilage  Skin:  Negative for rash.  Neurological:  Negative for loss of consciousness and headaches.        Objective:   Vitals: BP 110/70   Pulse 64   Temp 98.8 F (37.1 C) (Tympanic)   Ht 5' 4.5" (1.638 m)   Wt 168 lb (76.2 kg)   SpO2 98%   BMI 28.39 kg/m    Physical Exam Vitals and nursing note reviewed.  Constitutional:      General: She is not in acute distress.    Appearance: Normal appearance. She is not toxic-appearing.  HENT:     Head: Normocephalic and atraumatic.  Pulmonary:     Effort: Pulmonary effort is normal.  Musculoskeletal:     Left knee: No crepitus.  Skin:    General: Skin is warm and dry.  Neurological:     Mental Status: She is alert and oriented to person, place, and time. Mental status is at baseline.  Psychiatric:        Mood and Affect: Mood normal.        Behavior: Behavior normal.        Thought Content: Thought content normal.        Judgment: Judgment normal.       Results:   Studies obtained and personally reviewed by me:   Labs:  Component Value Date/Time   NA 142 07/16/2022 0915   K 4.3 07/16/2022 0915   CL 108 07/16/2022 0915   CO2 27 07/16/2022 0915   GLUCOSE 103 (H) 07/16/2022 0915   BUN 18 07/16/2022 0915   CREATININE 0.68 07/16/2022 0915   CALCIUM 9.5 07/16/2022 0915   PROT 7.6 01/18/2023 0940   ALBUMIN 4.5 06/27/2017 1000   AST 20 01/18/2023 0940   ALT 16 01/18/2023 0940   ALKPHOS 48 06/27/2017 1000   BILITOT 0.6 01/18/2023 0940   GFRNONAA 88 07/03/2020 0930   GFRAA 102 07/03/2020 0930     Lab Results  Component Value Date   WBC 4.7 07/16/2022   HGB 14.1 07/16/2022   HCT 41.6 07/16/2022   MCV 89.3 07/16/2022   PLT 179 07/16/2022    Lab Results  Component Value Date   CHOL 206 (H) 01/18/2023   HDL 50 01/18/2023   LDLCALC 114 (H) 01/18/2023   TRIG 292 (H) 01/18/2023   CHOLHDL 4.1 01/18/2023    Lab Results  Component Value  Date   HGBA1C 6.3 (H) 01/18/2023     Lab Results  Component Value Date   TSH 2.09 07/16/2022      Assessment & Plan:   Chronic back pain: Improved after being treated with Mobic 15 mg daily, Flexeril 5-10 mg three times daily as needed. Back pain is stable.  Currently not using Mobic or Flexeril.  Left knee pain: having pain after leaning into car recently.Recommend ice to knee 20 min once or twice daily.  See ortho if pain persists   Glucose intolerance: HGBA1c at 6.3% on 01/18/23. Will recheck in 3 months. Needs to get back on diet and exercise regimen. Does not want to be oral agent but I did offer this  Mixed hyperlipidemia: CHOL elevated at 206, LDL at 114, TRIG at 292 on 01/18/23. She will be walking more and eating better now after having just returned from a trip.Declines statin therapy  Vaccine counseling: Tetanus vaccine booster update given   Return in 3 months for A1c, lipid check and office visit.patient promises to work diligently on diet and exercise. She prefers not to be on meds for cholesterol or diabetes.    I,Alexander Ruley,acting as a Education administrator for Elby Showers, MD.,have documented all relevant documentation on the behalf of Elby Showers, MD,as directed by  Elby Showers, MD while in the presence of Elby Showers, MD.   I, Elby Showers, MD, have reviewed all documentation for this visit. The documentation on 01/23/23 for the exam, diagnosis, procedures, and orders are all accurate and complete.

## 2023-01-17 ENCOUNTER — Other Ambulatory Visit: Payer: PPO

## 2023-01-18 ENCOUNTER — Other Ambulatory Visit: Payer: PPO

## 2023-01-18 DIAGNOSIS — R7302 Impaired glucose tolerance (oral): Secondary | ICD-10-CM

## 2023-01-18 DIAGNOSIS — E782 Mixed hyperlipidemia: Secondary | ICD-10-CM | POA: Diagnosis not present

## 2023-01-19 LAB — LIPID PANEL
Cholesterol: 206 mg/dL — ABNORMAL HIGH (ref <200)
HDL: 50 mg/dL (ref >=50)
LDL Cholesterol (Calc): 114 mg/dL (calc) — ABNORMAL HIGH
Non-HDL Cholesterol (Calc): 156 mg/dL (calc) — ABNORMAL HIGH (ref <130)
Total CHOL/HDL Ratio: 4.1 (calc) (ref <5.0)
Triglycerides: 292 mg/dL — ABNORMAL HIGH (ref <150)

## 2023-01-19 LAB — HEMOGLOBIN A1C
Hgb A1c MFr Bld: 6.3 % of total Hgb — ABNORMAL HIGH (ref <5.7)
Mean Plasma Glucose: 134 mg/dL
eAG (mmol/L): 7.4 mmol/L

## 2023-01-19 LAB — HEPATIC FUNCTION PANEL
AG Ratio: 1.5 (calc) (ref 1.0–2.5)
ALT: 16 U/L (ref 6–29)
AST: 20 U/L (ref 10–35)
Albumin: 4.6 g/dL (ref 3.6–5.1)
Alkaline phosphatase (APISO): 63 U/L (ref 37–153)
Bilirubin, Direct: 0.1 mg/dL (ref 0.0–0.2)
Globulin: 3 g/dL (calc) (ref 1.9–3.7)
Indirect Bilirubin: 0.5 mg/dL (calc) (ref 0.2–1.2)
Total Bilirubin: 0.6 mg/dL (ref 0.2–1.2)
Total Protein: 7.6 g/dL (ref 6.1–8.1)

## 2023-01-20 ENCOUNTER — Ambulatory Visit (INDEPENDENT_AMBULATORY_CARE_PROVIDER_SITE_OTHER): Payer: PPO | Admitting: Internal Medicine

## 2023-01-20 ENCOUNTER — Encounter: Payer: Self-pay | Admitting: Internal Medicine

## 2023-01-20 VITALS — BP 110/70 | HR 64 | Temp 98.8°F | Ht 64.5 in | Wt 168.0 lb

## 2023-01-20 DIAGNOSIS — E782 Mixed hyperlipidemia: Secondary | ICD-10-CM

## 2023-01-20 DIAGNOSIS — M25562 Pain in left knee: Secondary | ICD-10-CM

## 2023-01-20 DIAGNOSIS — Z8659 Personal history of other mental and behavioral disorders: Secondary | ICD-10-CM

## 2023-01-20 DIAGNOSIS — R7302 Impaired glucose tolerance (oral): Secondary | ICD-10-CM

## 2023-01-20 DIAGNOSIS — Z23 Encounter for immunization: Secondary | ICD-10-CM | POA: Diagnosis not present

## 2023-01-23 DIAGNOSIS — M25562 Pain in left knee: Secondary | ICD-10-CM | POA: Insufficient documentation

## 2023-01-23 DIAGNOSIS — E782 Mixed hyperlipidemia: Secondary | ICD-10-CM | POA: Insufficient documentation

## 2023-01-23 NOTE — Patient Instructions (Addendum)
Apply ice to left knee for 20 minutes once or twice daily and see orthopedist if pain does not improve within a few days.  Chronic back pain can be treated with Mobic and/or Flexeril.  Back pain seems stable at the present time.  Patient does not want to be on medication for glucose intolerance or for mixed hyperlipidemia.  She wants to work on diet and exercise and return here in 3 months for follow-up.  We will give her a chance to improve using diet and exercise only.  If knee continues to bother her she may need to see orthopedist.  Tetanus update given today which will be good for 10 years.

## 2023-02-24 ENCOUNTER — Encounter: Payer: Self-pay | Admitting: Internal Medicine

## 2023-02-24 ENCOUNTER — Ambulatory Visit (INDEPENDENT_AMBULATORY_CARE_PROVIDER_SITE_OTHER): Payer: PPO | Admitting: Internal Medicine

## 2023-02-24 ENCOUNTER — Ambulatory Visit
Admission: RE | Admit: 2023-02-24 | Discharge: 2023-02-24 | Disposition: A | Discharge status: Home / Self Care | Payer: PPO | Source: Ambulatory Visit | Attending: Internal Medicine | Admitting: Internal Medicine

## 2023-02-24 VITALS — BP 108/68 | HR 76 | Temp 99.1°F | Ht 64.5 in | Wt 167.1 lb

## 2023-02-24 DIAGNOSIS — Z8659 Personal history of other mental and behavioral disorders: Secondary | ICD-10-CM | POA: Diagnosis not present

## 2023-02-24 DIAGNOSIS — R0781 Pleurodynia: Secondary | ICD-10-CM

## 2023-02-24 DIAGNOSIS — S299XXA Unspecified injury of thorax, initial encounter: Secondary | ICD-10-CM | POA: Diagnosis not present

## 2023-02-24 DIAGNOSIS — R7302 Impaired glucose tolerance (oral): Secondary | ICD-10-CM

## 2023-02-24 NOTE — Progress Notes (Addendum)
Patient Care Team: Margaree Mackintosh, MD as PCP - General (Internal Medicine)  Visit Date: 02/24/23  Subjective:    Patient ID: Natalie [name] , Female   DOB: 1946-05-02, 77 y.o.    MRN: [id number]   77 y.o. Female presents today for pain in her right ribs after accidentally striking that area on a bench on 02/21/23 while visiting a restaurant in [place], [place].. This is impacting her sleep. Pain is worse when getting in and out of bed. She is worried about possible rib fracture. Hx of glucose intolerance currently diet controlled. Hx of anxiety.   Past Medical History:  Diagnosis Date   Allergic rhinitis    Diabetes mellitus without complication      Family History  Problem Relation Age of Onset   Lung disease Father        diffuse alveolar, following lung injury in MVA   Cancer Other    Breast cancer Maternal Grandmother     Social History   Social History Narrative   Regular exercise- no       Review of Systems  Constitutional:  Negative for fever and malaise/fatigue.  HENT:  Negative for congestion.   Eyes:  Negative for blurred vision.  Respiratory:  Negative for cough and shortness of breath.   Cardiovascular:  Negative for chest pain, palpitations and leg swelling.  Gastrointestinal:  Negative for vomiting.  Musculoskeletal:  Negative for back pain.       (+) Pain in right ribs  Skin:  Negative for rash.  Neurological:  Negative for loss of consciousness and headaches.        Objective:   Vitals: BP 108/68   Pulse 76   Temp 99.1 F (37.3 C) (Tympanic)   Ht 5' 4.5" (1.638 m)   Wt 167 lb 1.9 oz (75.8 kg)   SpO2 97%   BMI 28.24 kg/m    Physical Exam Vitals and nursing note reviewed.  Constitutional:      General: She is not in acute distress.    Appearance: Normal appearance. She is not toxic-appearing.  HENT:     Head: Normocephalic and atraumatic.  Pulmonary:     Effort: Pulmonary effort is normal. No respiratory distress.      Breath sounds: Normal breath sounds. No wheezing or rales.  Musculoskeletal:     Comments: Tenderness in lower right lateral rib cage.  Skin:    General: Skin is warm and dry.  Neurological:     Mental Status: She is alert and oriented to person, place, and time. Mental status is at baseline.  Psychiatric:        Mood and Affect: Mood normal.        Behavior: Behavior normal.        Thought Content: Thought content normal.        Judgment: Judgment normal.       Results:   Studies obtained and personally reviewed by me:    Labs:       Component Value Date/Time   NA 142 07/16/2022 0915   K 4.3 07/16/2022 0915   CL 108 07/16/2022 0915   CO2 27 07/16/2022 0915   GLUCOSE 103 (H) 07/16/2022 0915   BUN 18 07/16/2022 0915   CREATININE 0.68 07/16/2022 0915   CALCIUM 9.5 07/16/2022 0915   PROT 7.6 01/18/2023 0940   ALBUMIN 4.5 06/27/2017 1000   AST 20 01/18/2023 0940   ALT 16 01/18/2023 0940   ALKPHOS 48 06/27/2017 1000  BILITOT 0.6 01/18/2023 0940   GFRNONAA 88 07/03/2020 0930   GFRAA 102 07/03/2020 0930     Lab Results  Component Value Date   WBC 4.7 07/16/2022   HGB 14.1 07/16/2022   HCT 41.6 07/16/2022   MCV 89.3 07/16/2022   PLT 179 07/16/2022    Lab Results  Component Value Date   CHOL 206 (H) 01/18/2023   HDL 50 01/18/2023   LDLCALC 114 (H) 01/18/2023   TRIG 292 (H) 01/18/2023   CHOLHDL 4.1 01/18/2023    Lab Results  Component Value Date   HGBA1C 6.3 (H) 01/18/2023     Lab Results  Component Value Date   TSH 2.09 07/16/2022      Assessment & Plan:   Lower right lateral rib cage pain: struck this area on a bench earlier this week and has been having pain since. Ordered CXR with rib detail- no fracture noted. May take Meloxicam or Advil if needed or even Hydrocodone APAP if very uncomfortable. This is like a bruise/ contusion. No rib fracture found.  Anxiety- worried about health  Glucose intolerance- currently treated with  diet  Hypertriglyceridemia- declines medication  I,Alexander Ruley,acting as a scribe for Margaree Mackintosh, MD.,have documented all relevant documentation on the behalf of Margaree Mackintosh, MD,as directed by  Margaree Mackintosh, MD while in the presence of Margaree Mackintosh, MD.   I, Margaree Mackintosh, MD, have reviewed all documentation for this visit. The documentation on 02/28/23 for the exam, diagnosis, procedures, and orders are all accurate and complete.

## 2023-02-28 NOTE — Patient Instructions (Signed)
Right rib x-ray does not show a rib fracture.  This seems just to be a contusion.  May take meloxicam 15 mg daily for pain.  May use heating pad or ice whichever feels better to the area.  This may take at least 2 weeks to heal.  Physical therapy can be ordered if you so desire.

## 2023-03-10 IMAGING — MG MM DIGITAL SCREENING BILAT W/ TOMO AND CAD
6 of 12 series · 6 of 36 positions shown · non-contrast
Comparison: Previous exam(s).

CLINICAL DATA: Screening.

EXAM:
DIGITAL SCREENING BILATERAL MAMMOGRAM WITH TOMOSYNTHESIS AND CAD
TECHNIQUE: Bilateral screening digital craniocaudal and mediolateral oblique
mammograms were obtained. Bilateral screening digital breast
tomosynthesis was performed. The images were evaluated with
computer-aided detection.

[L XCCL synth-2D]
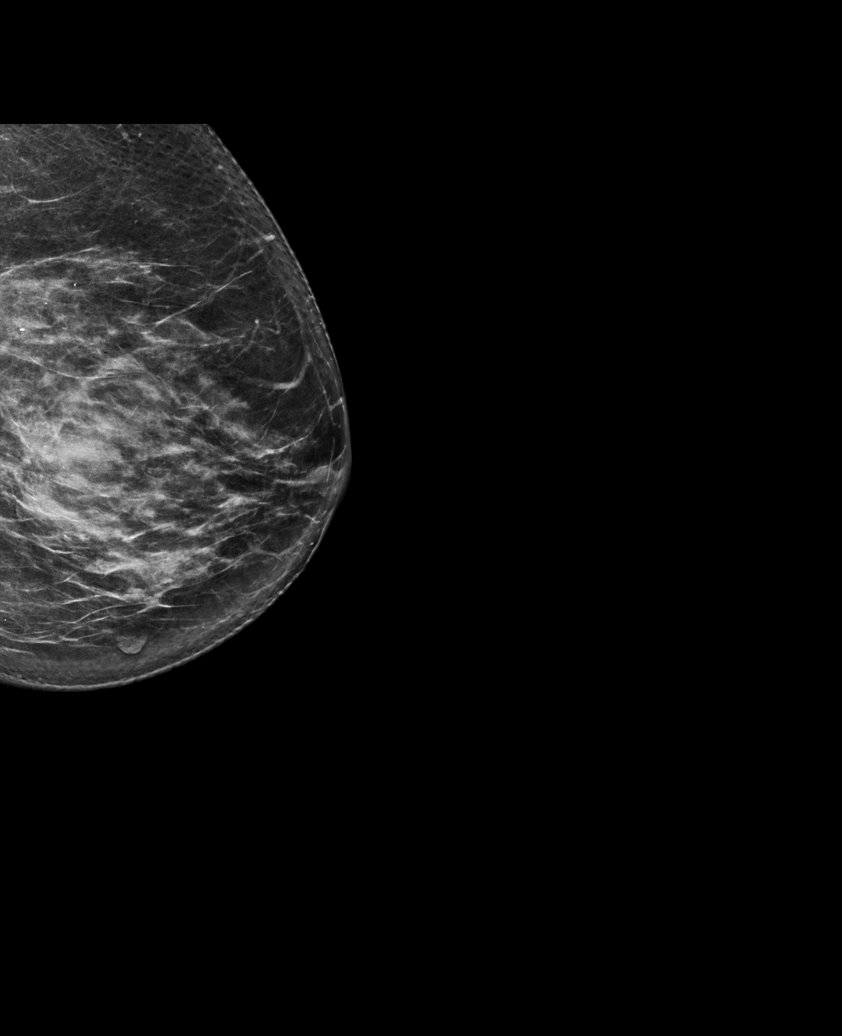

[L MLO synth-2D]
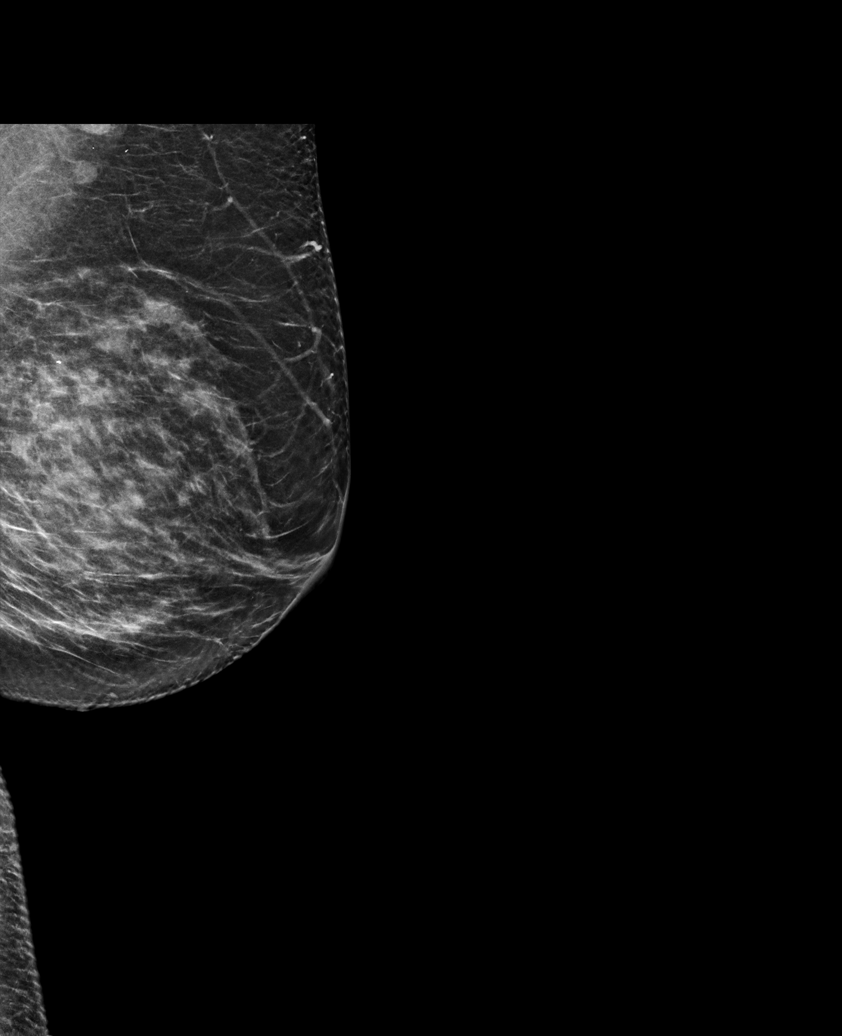

[R CC synth-2D]
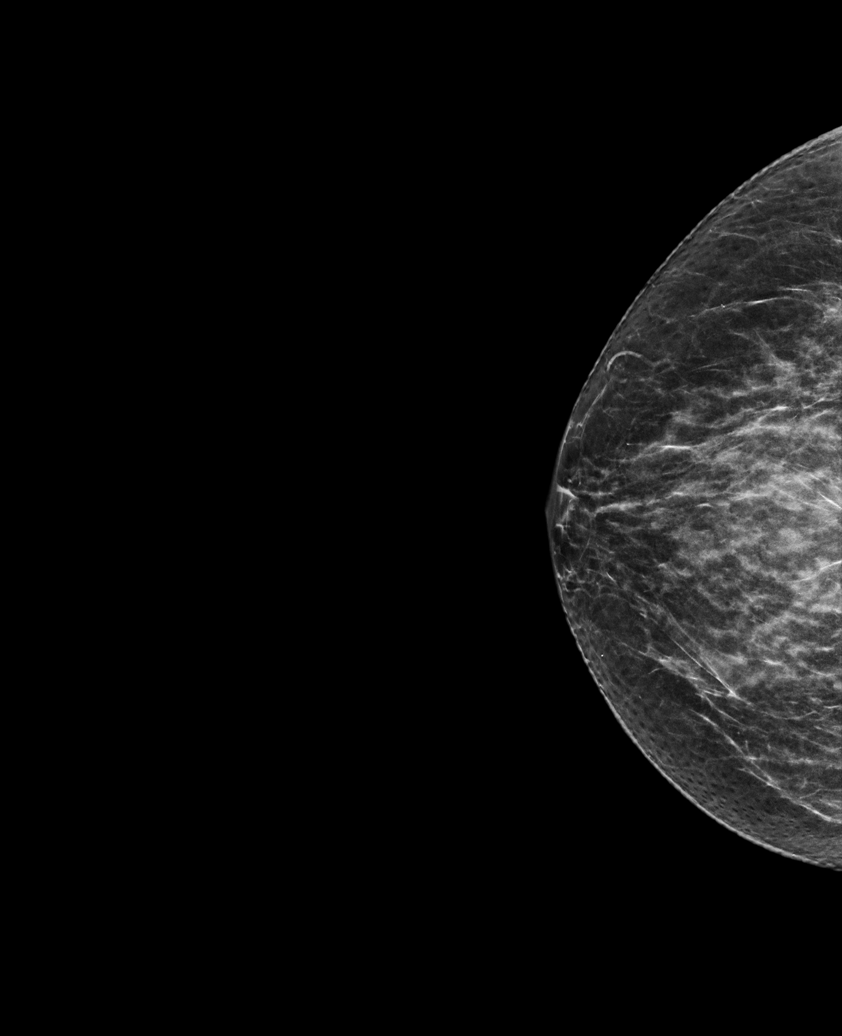

[R MLO synth-2D]
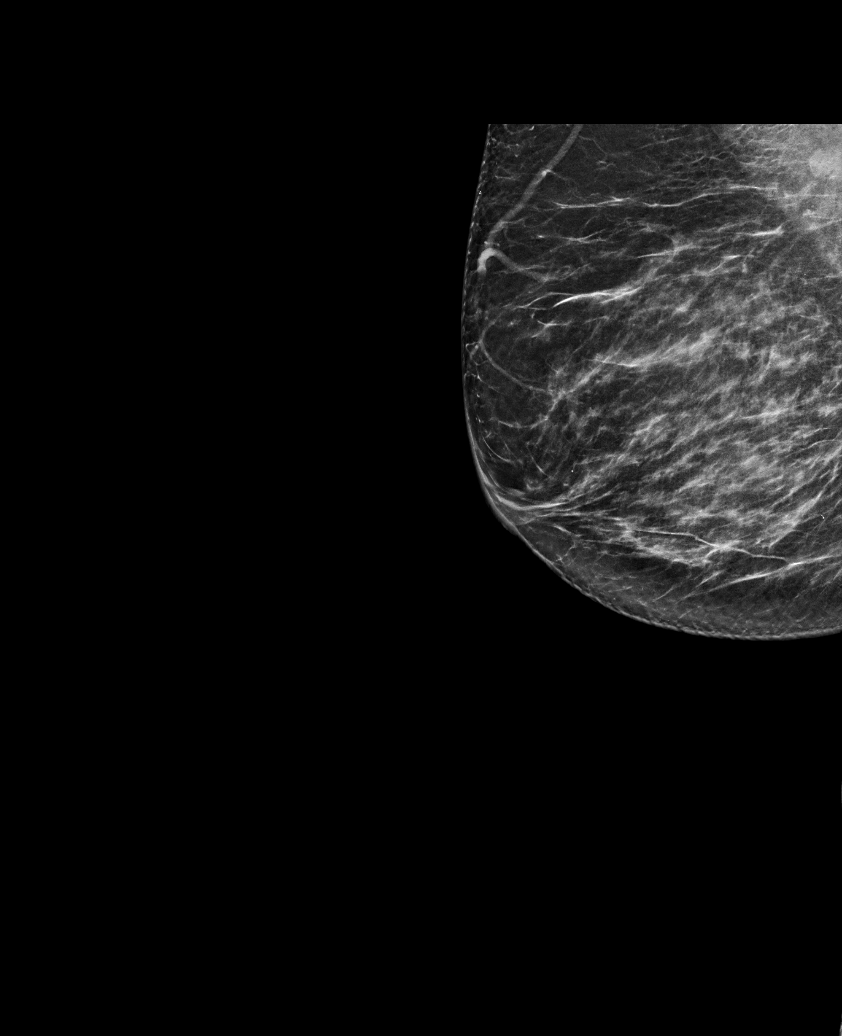

[R XCCL synth-2D]
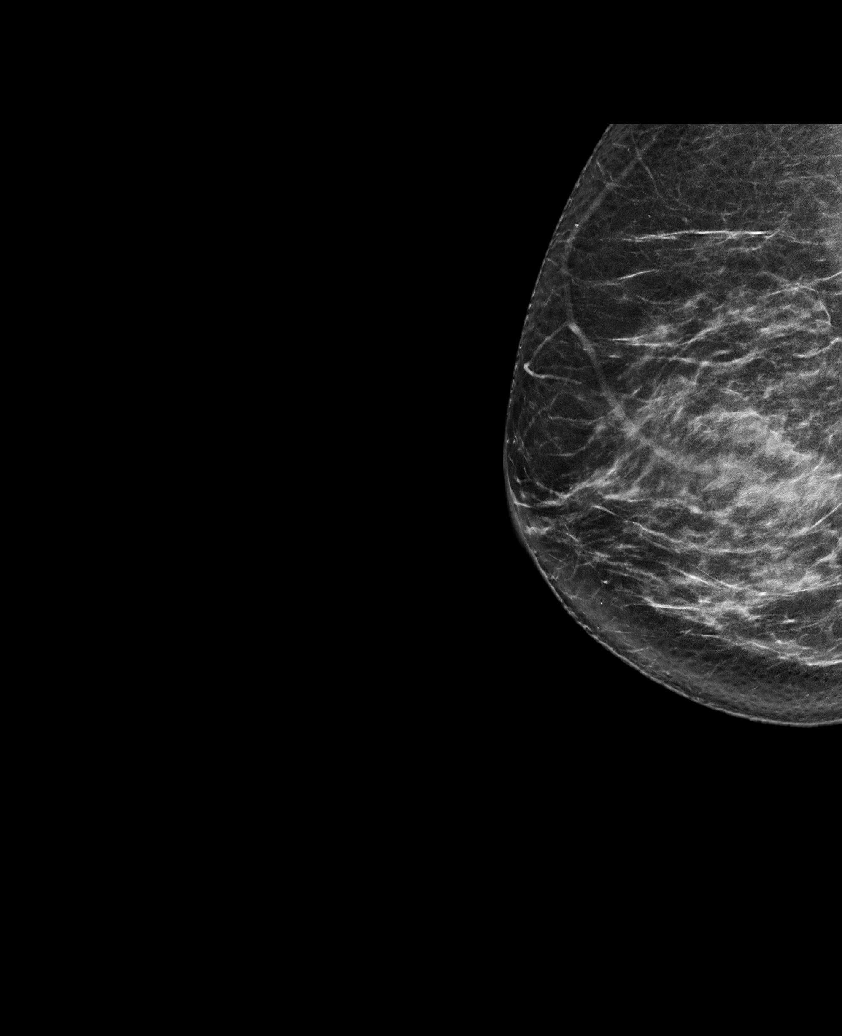

[L CC synth-2D]
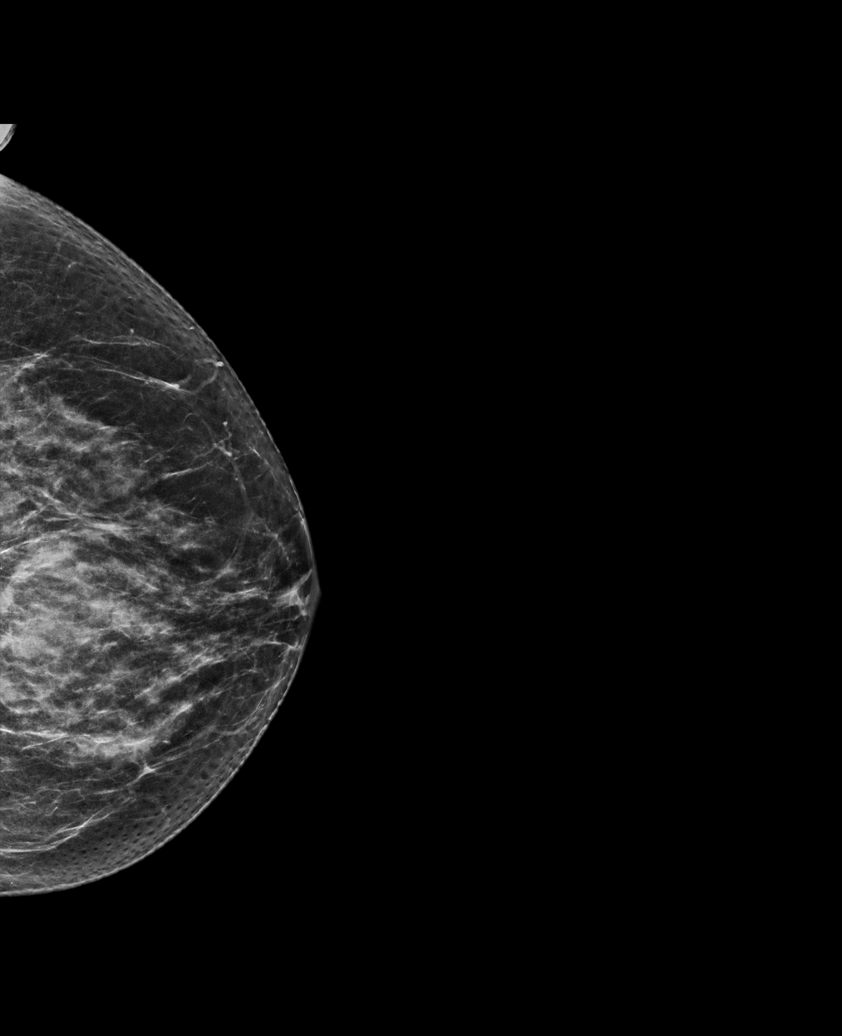

[6 of 36 positions shown; findings below may reference images not displayed]

ACR Breast Density Category c: The breast tissue is heterogeneously
dense, which may obscure small masses.
FINDINGS: There are no findings suspicious for malignancy.
IMPRESSION: No mammographic evidence of malignancy. A result letter of this
screening mammogram will be mailed directly to the patient.

RECOMMENDATION:
Screening mammogram in one year. (Code:Q3-W-BC3)

BI-RADS CATEGORY  1: Negative.

## 2023-04-21 NOTE — Progress Notes (Signed)
Patient Care Team: Margaree Mackintosh, MD as PCP - General (Internal Medicine)  Visit Date: 04/28/23  Subjective:    Patient ID: Natalie [name] , Female   DOB: 08/13/1946, 77 y.o.    MRN: [id number]   77 y.o. Female presents today for a 3 month A1c, lipids check. History of impaired glucose tolerance. Currently not taking medication. HGBA1c at 6.3% on 04/26/23, no change from 01/18/23.  History of mixed hyperlipidemia. Not currently on medication. HDL low at 41. TRIG elevated at 239. LDL elevated at 116.  Past Medical History:  Diagnosis Date   Allergic rhinitis    Diabetes mellitus without complication (HCC)      Family History  Problem Relation Age of Onset   Lung disease Father        diffuse alveolar, following lung injury in MVA   Cancer Other    Breast cancer Maternal Grandmother     Social History   Social History Narrative   Regular exercise- no       Review of Systems  Constitutional:  Negative for fever and malaise/fatigue.  HENT:  Negative for congestion.   Eyes:  Negative for blurred vision.  Respiratory:  Negative for cough and shortness of breath.   Cardiovascular:  Negative for chest pain, palpitations and leg swelling.  Gastrointestinal:  Negative for vomiting.  Musculoskeletal:  Negative for back pain.  Skin:  Negative for rash.  Neurological:  Negative for loss of consciousness and headaches.        Objective:   Vitals: BP 124/80   Pulse 70   Temp 98.4 F (36.9 C) (Tympanic)   Resp 16   Ht 5' 4.5" (1.638 m)   Wt 165 lb 8 oz (75.1 kg)   SpO2 95%   BMI 27.97 kg/m    Physical Exam Vitals and nursing note reviewed.  Constitutional:      General: She is not in acute distress.    Appearance: Normal appearance. She is not toxic-appearing.  HENT:     Head: Normocephalic and atraumatic.  Neck:     Vascular: No carotid bruit.  Cardiovascular:     Rate and Rhythm: Normal rate and regular rhythm. No extrasystoles are present.     Pulses: Normal pulses.     Heart sounds: Normal heart sounds. No murmur heard.    No friction rub. No gallop.  Pulmonary:     Effort: Pulmonary effort is normal. No respiratory distress.     Breath sounds: Normal breath sounds. No wheezing or rales.  Skin:    General: Skin is warm and dry.  Neurological:     Mental Status: She is alert and oriented to person, place, and time. Mental status is at baseline.  Psychiatric:        Mood and Affect: Mood normal.        Behavior: Behavior normal.        Thought Content: Thought content normal.        Judgment: Judgment normal.       Results:   Studies obtained and personally reviewed by me:   Labs:       Component Value Date/Time   NA 142 07/16/2022 0915   K 4.3 07/16/2022 0915   CL 108 07/16/2022 0915   CO2 27 07/16/2022 0915   GLUCOSE 103 (H) 07/16/2022 0915   BUN 18 07/16/2022 0915   CREATININE 0.68 07/16/2022 0915   CALCIUM 9.5 07/16/2022 0915   PROT 7.6 01/18/2023 0940  ALBUMIN 4.5 06/27/2017 1000   AST 20 01/18/2023 0940   ALT 16 01/18/2023 0940   ALKPHOS 48 06/27/2017 1000   BILITOT 0.6 01/18/2023 0940   GFRNONAA 88 07/03/2020 0930   GFRAA 102 07/03/2020 0930     Lab Results  Component Value Date   WBC 4.7 07/16/2022   HGB 14.1 07/16/2022   HCT 41.6 07/16/2022   MCV 89.3 07/16/2022   PLT 179 07/16/2022    Lab Results  Component Value Date   CHOL 193 04/26/2023   HDL 41 (L) 04/26/2023   LDLCALC 116 (H) 04/26/2023   TRIG 239 (H) 04/26/2023   CHOLHDL 4.7 04/26/2023    Lab Results  Component Value Date   HGBA1C 6.3 (H) 04/26/2023     Lab Results  Component Value Date   TSH 2.09 07/16/2022      Assessment & Plan:   Type 2 Diabetes mellitus: start Januvia 50 mg daily. HGBA1c at 6.3% on 04/26/23, no change from 01/18/23.   Mixed hyperlipidemia: not currently on medication. HDL low at 41. TRIG elevated at 239. LDL elevated at 116. Will work on controlling with healthy diet and exercise. Does not  want to take statin.  Anxiety- takes Xanax prior to airplane travel as directed  Return in 2 months for A1c, lipid recheck and office visit.    I,Alexander Ruley,acting as a Neurosurgeon for Margaree Mackintosh, MD.,have documented all relevant documentation on the behalf of Margaree Mackintosh, MD,as directed by  Margaree Mackintosh, MD while in the presence of Margaree Mackintosh, MD.   I, Margaree Mackintosh, MD, have reviewed all documentation for this visit. The documentation on 04/28/23 for the exam, diagnosis, procedures, and orders are all accurate and complete.

## 2023-04-26 ENCOUNTER — Other Ambulatory Visit: Payer: PPO

## 2023-04-26 DIAGNOSIS — R7302 Impaired glucose tolerance (oral): Secondary | ICD-10-CM

## 2023-04-26 DIAGNOSIS — E782 Mixed hyperlipidemia: Secondary | ICD-10-CM | POA: Diagnosis not present

## 2023-04-27 LAB — HEMOGLOBIN A1C
Hgb A1c MFr Bld: 6.3 % of total Hgb — ABNORMAL HIGH (ref <5.7)
Mean Plasma Glucose: 134 mg/dL
eAG (mmol/L): 7.4 mmol/L

## 2023-04-27 LAB — LIPID PANEL
Cholesterol: 193 mg/dL (ref <200)
HDL: 41 mg/dL — ABNORMAL LOW (ref >=50)
LDL Cholesterol (Calc): 116 mg/dL (calc) — ABNORMAL HIGH
Non-HDL Cholesterol (Calc): 152 mg/dL (calc) — ABNORMAL HIGH (ref <130)
Total CHOL/HDL Ratio: 4.7 (calc) (ref <5.0)
Triglycerides: 239 mg/dL — ABNORMAL HIGH (ref <150)

## 2023-04-28 ENCOUNTER — Encounter: Payer: Self-pay | Admitting: Internal Medicine

## 2023-04-28 ENCOUNTER — Ambulatory Visit (INDEPENDENT_AMBULATORY_CARE_PROVIDER_SITE_OTHER): Payer: PPO | Admitting: Internal Medicine

## 2023-04-28 VITALS — BP 124/80 | HR 70 | Temp 98.4°F | Resp 16 | Ht 64.5 in | Wt 165.5 lb

## 2023-04-28 DIAGNOSIS — E782 Mixed hyperlipidemia: Secondary | ICD-10-CM | POA: Diagnosis not present

## 2023-04-28 DIAGNOSIS — E119 Type 2 diabetes mellitus without complications: Secondary | ICD-10-CM

## 2023-04-28 DIAGNOSIS — Z8659 Personal history of other mental and behavioral disorders: Secondary | ICD-10-CM

## 2023-04-28 MED ORDER — SITAGLIPTIN PHOSPHATE 50 MG PO TABS: 50.0000 mg | ORAL_TABLET | Freq: Every day | ORAL | 1 refills | Status: DC

## 2023-04-28 NOTE — Patient Instructions (Signed)
Begin Januvia 50 mg daily and follow up in September. Will check lipids then as well as it will be time for her CPE. Watch diet and continue exercise program.

## 2023-07-21 NOTE — Progress Notes (Signed)
Annual Wellness Visit    Patient Care Team: Margaree Mackintosh, MD as PCP - General (Internal Medicine)  Visit Date: 07/28/23   Chief Complaint  Patient presents with   Medicare Wellness    Subjective:   Patient: Natalie [name], Female    DOB: 02-Jul-1946, 77 y.o.   MRN: [id number]  Natalie [name] is a 77 y.o. Female who presents today for her Annual Wellness Visit. History of allergic rhinitis, Type 2 diabetes mellitus, hyperlipidemia, osteoporosis.   History of Type 2 diabetes mellitus treated with Januvia 50 mg daily. Diagnosed in July 2016 after presenting with urinary frequency. HGBA1c at 6.1% on 07/26/23, down from 6.3% on 04/26/23.   History of hyperlipidemia. She is agreeable to trying low-dose statin. HDL low at 46. TRIG elevated at 206 on 07/26/23, down from 239 on 04/26/23. LDL elevated at 119, up from 116.   History of osteoporosis in the right femoral neck with bone density study in 2019 showing T-score -3.1.  Has not wanted to be on Prolia or Boniva.  She was hospitalized in 2012 with a bout of gastroenteritis resulting in partial small bowel obstruction.  Was treated with IV fluids, bowel rest and subsequently obstruction resolved.   Past medical history includes anxiety related to airplane flight.  History of fractured foot secondary to a fall in 1986.  Right shoulder adhesive capsulitis November 2010.  History of vaginal hysterectomy with enterocele and rectocele repair in 2004.  Glucose elevated at 116. Kidney, liver functions normal. Electrolytes normal. Blood proteins normal. CBC normal. TSH at 2.09.   Mammogram last completed 11/19/22. No mammographic evidence of malignancy. Repeat recommended in 2025.  Had colonoscopy in May 2012 showing hyperplastic polyps and has not had repeat study.   Social history: She is married.  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  She does not smoke.  Social alcohol consumption.  She has a Systems analyst.  She has not worked outside the home since 1996.  2 adult children.  Husband has cardiac issues.  They enjoy traveling and going to [place].   Family history: Father died at age 75 due to diffuse alveolar damage of the lung.  Mother died at age 70 with complications of pneumonia.  1 sister in good health.  Past Medical History:  Diagnosis Date   Allergic rhinitis    Diabetes mellitus without complication (HCC)      Family History  Problem Relation Age of Onset   Lung disease Father        diffuse alveolar, following lung injury in MVA   Cancer Other    Breast cancer Maternal Grandmother      Social History   Social History Narrative   Regular exercise- no      Review of Systems  Constitutional:  Negative for chills, fever, malaise/fatigue and weight loss.  HENT:  Negative for hearing loss, sinus pain and sore throat.   Respiratory:  Negative for cough, hemoptysis and shortness of breath.   Cardiovascular:  Negative for chest pain, palpitations, leg swelling and PND.  Gastrointestinal:  Negative for abdominal pain, constipation, diarrhea, heartburn, nausea and vomiting.  Genitourinary:  Negative for dysuria, frequency and urgency.  Musculoskeletal:  Negative for back pain, myalgias and neck pain.  Skin:  Negative for itching and rash.  Neurological:  Negative for dizziness, tingling, seizures and headaches.  Endo/Heme/Allergies:  Negative for polydipsia.  Psychiatric/Behavioral:  Negative for depression. The patient is not nervous/anxious.  Objective:   Vitals: BP 100/80   Pulse 76   Ht 5' 4.5" (1.638 m)   Wt 167 lb (75.8 kg)   SpO2 95%   BMI 28.22 kg/m   Physical Exam Vitals and nursing note reviewed.  Constitutional:      General: She is not in acute distress.    Appearance: Normal appearance. She is not ill-appearing or toxic-appearing.  HENT:     Head: Normocephalic and atraumatic.     Right Ear: Hearing, tympanic membrane,  ear canal and external ear normal.     Left Ear: Hearing, tympanic membrane, ear canal and external ear normal.     Mouth/Throat:     Pharynx: Oropharynx is clear.  Eyes:     Extraocular Movements: Extraocular movements intact.     Pupils: Pupils are equal, round, and reactive to light.  Neck:     Thyroid: No thyroid mass, thyromegaly or thyroid tenderness.     Vascular: No carotid bruit.  Cardiovascular:     Rate and Rhythm: Normal rate and regular rhythm. No extrasystoles are present.    Pulses:          Dorsalis pedis pulses are 2+ on the right side and 2+ on the left side.     Heart sounds: Normal heart sounds. No murmur heard.    No friction rub. No gallop.  Pulmonary:     Effort: Pulmonary effort is normal.     Breath sounds: Normal breath sounds. No decreased breath sounds, wheezing, rhonchi or rales.  Chest:     Chest wall: No mass.  Abdominal:     Palpations: Abdomen is soft. There is no hepatomegaly, splenomegaly or mass.     Tenderness: There is no abdominal tenderness.     Hernia: No hernia is present.  Musculoskeletal:     Cervical back: Normal range of motion.     Right lower leg: No edema.     Left lower leg: No edema.  Lymphadenopathy:     Cervical: No cervical adenopathy.     Upper Body:     Right upper body: No supraclavicular adenopathy.     Left upper body: No supraclavicular adenopathy.  Skin:    General: Skin is warm and dry.  Neurological:     General: No focal deficit present.     Mental Status: She is alert and oriented to person, place, and time. Mental status is at baseline.     Sensory: Sensation is intact.     Motor: Motor function is intact. No weakness.     Deep Tendon Reflexes: Reflexes are normal and symmetric.  Psychiatric:        Attention and Perception: Attention normal.        Mood and Affect: Mood normal.        Speech: Speech normal.        Behavior: Behavior normal.        Thought Content: Thought content normal.         Cognition and Memory: Cognition normal.        Judgment: Judgment normal.      Most recent functional status assessment:    07/28/2023   10:11 AM  In your present state of health, do you have any difficulty performing the following activities:  Hearing? 0  Vision? 0  Difficulty concentrating or making decisions? 0  Walking or climbing stairs? 0  Dressing or bathing? 0  Doing errands, shopping? 0  Preparing Food and eating ? N  Using the Toilet? N  In the past six months, have you accidently leaked urine? N  Do you have problems with loss of bowel control? N  Managing your Medications? N  Managing your Finances? N  Housekeeping or managing your Housekeeping? N   Most recent fall risk assessment:    07/28/2023   10:16 AM  Fall Risk   Falls in the past year? 0  Number falls in past yr: 0  Injury with Fall? 0  Risk for fall due to : No Fall Risks  Follow up Falls evaluation completed    Most recent depression screenings:    07/28/2023   10:12 AM 01/20/2023   10:40 AM  PHQ 2/9 Scores  PHQ - 2 Score 0 0   Most recent cognitive screening:    07/28/2023   10:16 AM  6CIT Screen  What Year? 0 points  What month? 0 points  What time? 0 points  Count back from 20 0 points  Months in reverse 0 points  Repeat phrase 0 points  Total Score 0 points     Results:   Studies obtained and personally reviewed by me:  Mammogram last completed 11/19/22. No mammographic evidence of malignancy. Repeat recommended in 2025.  Had colonoscopy in May 2012 showing hyperplastic polyps and has not had repeat study.  Labs:       Component Value Date/Time   NA 141 07/26/2023 0918   K 4.5 07/26/2023 0918   CL 106 07/26/2023 0918   CO2 26 07/26/2023 0918   GLUCOSE 116 (H) 07/26/2023 0918   BUN 16 07/26/2023 0918   CREATININE 0.67 07/26/2023 0918   CALCIUM 9.6 07/26/2023 0918   PROT 7.3 07/26/2023 0918   ALBUMIN 4.5 06/27/2017 1000   AST 21 07/26/2023 0918   ALT 18 07/26/2023  0918   ALKPHOS 48 06/27/2017 1000   BILITOT 0.6 07/26/2023 0918   GFRNONAA 88 07/03/2020 0930   GFRAA 102 07/03/2020 0930     Lab Results  Component Value Date   WBC 6.0 07/26/2023   HGB 14.3 07/26/2023   HCT 43.4 07/26/2023   MCV 90.8 07/26/2023   PLT 189 07/26/2023    Lab Results  Component Value Date   CHOL 197 07/26/2023   HDL 46 (L) 07/26/2023   LDLCALC 119 (H) 07/26/2023   TRIG 206 (H) 07/26/2023   CHOLHDL 4.3 07/26/2023    Lab Results  Component Value Date   HGBA1C 6.1 (H) 07/26/2023     Lab Results  Component Value Date   TSH 2.09 07/26/2023    Assessment & Plan:   Type 2 diabetes mellitus: treated with Januvia 50 mg daily. HGBA1c at 6.1% on 07/26/23, down from 6.3% on 04/26/23.   Hyperlipidemia: start rosuvastatin 5 mg three times weekly. HDL low at 46. TRIG elevated at 206 on 07/26/23, down from 239 on 04/26/23. LDL elevated at 119, up from 116.   Osteoporosis: T-score in the right femoral neck at -3.1 with bone density study in 2019.  Has not wanted to be on Prolia or Boniva.   Pelvic exam deferred.   Urinalysis normal today.  Mammogram last completed 11/19/22. No mammographic evidence of malignancy. Repeat recommended in 2025.  Had colonoscopy in May 2012 showing hyperplastic polyps and has not had repeat study.  Vaccine counseling: UTD on tetanus vaccine. Administered flu vaccine. Suggested Covid-19 booster.  Return in 6 months for A1c, lipid, liver check.     Annual wellness visit done today including the all of  the following: Reviewed patient's Family Medical History Reviewed and updated list of patient's medical providers Assessment of cognitive impairment was done Assessed patient's functional ability Established a written schedule for health screening services Health Risk Assessent Completed and Reviewed  Discussed health benefits of physical activity, and encouraged her to engage in regular exercise appropriate for her age and condition.         I,Alexander Ruley,acting as a Neurosurgeon for Margaree Mackintosh, MD.,have documented all relevant documentation on the behalf of Margaree Mackintosh, MD,as directed by  Margaree Mackintosh, MD while in the presence of Margaree Mackintosh, MD.   I, Margaree Mackintosh, MD, have reviewed all documentation for this visit. The documentation on 07/28/23 for the exam, diagnosis, procedures, and orders are all accurate and complete.

## 2023-07-26 ENCOUNTER — Other Ambulatory Visit: Payer: PPO

## 2023-07-26 DIAGNOSIS — Z0000 Encounter for general adult medical examination without abnormal findings: Secondary | ICD-10-CM | POA: Diagnosis not present

## 2023-07-26 DIAGNOSIS — E782 Mixed hyperlipidemia: Secondary | ICD-10-CM | POA: Diagnosis not present

## 2023-07-26 DIAGNOSIS — R7302 Impaired glucose tolerance (oral): Secondary | ICD-10-CM

## 2023-07-26 DIAGNOSIS — E119 Type 2 diabetes mellitus without complications: Secondary | ICD-10-CM

## 2023-07-27 LAB — CBC WITH DIFFERENTIAL/PLATELET
Absolute Monocytes: 564 cells/uL (ref 200–950)
Basophils Absolute: 78 cells/uL (ref 0–200)
Basophils Relative: 1.3 %
Eosinophils Absolute: 186 cells/uL (ref 15–500)
Eosinophils Relative: 3.1 %
HCT: 43.4 % (ref 35.0–45.0)
Hemoglobin: 14.3 g/dL (ref 11.7–15.5)
Lymphs Abs: 1716 cells/uL (ref 850–3900)
MCH: 29.9 pg (ref 27.0–33.0)
MCHC: 32.9 g/dL (ref 32.0–36.0)
MCV: 90.8 fL (ref 80.0–100.0)
MPV: 10.7 fL (ref 7.5–12.5)
Monocytes Relative: 9.4 %
Neutro Abs: 3456 cells/uL (ref 1500–7800)
Neutrophils Relative %: 57.6 %
Platelets: 189 10*3/uL (ref 140–400)
RBC: 4.78 10*6/uL (ref 3.80–5.10)
RDW: 13 % (ref 11.0–15.0)
Total Lymphocyte: 28.6 %
WBC: 6 10*3/uL (ref 3.8–10.8)

## 2023-07-27 LAB — COMPLETE METABOLIC PANEL WITH GFR
AG Ratio: 1.7 (calc) (ref 1.0–2.5)
ALT: 18 U/L (ref 6–29)
AST: 21 U/L (ref 10–35)
Albumin: 4.6 g/dL (ref 3.6–5.1)
Alkaline phosphatase (APISO): 58 U/L (ref 37–153)
BUN: 16 mg/dL (ref 7–25)
CO2: 26 mmol/L (ref 20–32)
Calcium: 9.6 mg/dL (ref 8.6–10.4)
Chloride: 106 mmol/L (ref 98–110)
Creat: 0.67 mg/dL (ref 0.60–1.00)
Globulin: 2.7 g/dL (calc) (ref 1.9–3.7)
Glucose, Bld: 116 mg/dL — ABNORMAL HIGH (ref 65–99)
Potassium: 4.5 mmol/L (ref 3.5–5.3)
Sodium: 141 mmol/L (ref 135–146)
Total Bilirubin: 0.6 mg/dL (ref 0.2–1.2)
Total Protein: 7.3 g/dL (ref 6.1–8.1)
eGFR: 91 mL/min/{1.73_m2} (ref >=60)

## 2023-07-27 LAB — HEMOGLOBIN A1C
Hgb A1c MFr Bld: 6.1 % of total Hgb — ABNORMAL HIGH (ref <5.7)
Mean Plasma Glucose: 128 mg/dL
eAG (mmol/L): 7.1 mmol/L

## 2023-07-27 LAB — LIPID PANEL
Cholesterol: 197 mg/dL
HDL: 46 mg/dL — ABNORMAL LOW
LDL Cholesterol (Calc): 119 mg/dL — ABNORMAL HIGH
Non-HDL Cholesterol (Calc): 151 mg/dL — ABNORMAL HIGH
Total CHOL/HDL Ratio: 4.3 (calc)
Triglycerides: 206 mg/dL — ABNORMAL HIGH

## 2023-07-27 LAB — MICROALBUMIN / CREATININE URINE RATIO
Creatinine, Urine: 149 mg/dL (ref 20–275)
Microalb Creat Ratio: 7 mg/g creat (ref <30)
Microalb, Ur: 1 mg/dL

## 2023-07-27 LAB — TSH: TSH: 2.09 mIU/L (ref 0.40–4.50)

## 2023-07-28 ENCOUNTER — Encounter: Payer: Self-pay | Admitting: Internal Medicine

## 2023-07-28 ENCOUNTER — Ambulatory Visit: Payer: PPO | Admitting: Internal Medicine

## 2023-07-28 VITALS — BP 100/80 | HR 76 | Ht 64.5 in | Wt 167.0 lb

## 2023-07-28 DIAGNOSIS — Z8659 Personal history of other mental and behavioral disorders: Secondary | ICD-10-CM

## 2023-07-28 DIAGNOSIS — E782 Mixed hyperlipidemia: Secondary | ICD-10-CM | POA: Diagnosis not present

## 2023-07-28 DIAGNOSIS — Z23 Encounter for immunization: Secondary | ICD-10-CM | POA: Diagnosis not present

## 2023-07-28 DIAGNOSIS — Z0000 Encounter for general adult medical examination without abnormal findings: Secondary | ICD-10-CM

## 2023-07-28 DIAGNOSIS — E119 Type 2 diabetes mellitus without complications: Secondary | ICD-10-CM

## 2023-07-28 DIAGNOSIS — M810 Age-related osteoporosis without current pathological fracture: Secondary | ICD-10-CM

## 2023-07-28 LAB — POCT URINALYSIS DIP (CLINITEK)
Bilirubin, UA: NEGATIVE
Glucose, UA: NEGATIVE mg/dL
Ketones, POC UA: NEGATIVE mg/dL
Leukocytes, UA: NEGATIVE
Nitrite, UA: NEGATIVE
POC PROTEIN,UA: NEGATIVE
Spec Grav, UA: 1.02 (ref 1.010–1.025)
Urobilinogen, UA: 0.2 E.U./dL
pH, UA: 6 (ref 5.0–8.0)

## 2023-07-28 MED ORDER — ROSUVASTATIN CALCIUM 5 MG PO TABS: ORAL_TABLET | ORAL | 3 refills | Status: DC

## 2023-07-28 NOTE — Patient Instructions (Addendum)
It was a pleasure to see you today. Suggest statin medication. Suggest Crestor 5 mg 3 times a week. RTC in 6 months Hgb AIC, ;ipid and liver.

## 2023-09-21 ENCOUNTER — Telehealth: Payer: Self-pay

## 2023-09-21 NOTE — Patient Outreach (Signed)
Attempted to contact patient regarding DM eye. Left voicemail for patient to return my call at [phone].  Nicholes Rough, CMA Care Guide [facility]

## 2023-10-03 ENCOUNTER — Ambulatory Visit (INDEPENDENT_AMBULATORY_CARE_PROVIDER_SITE_OTHER): Payer: PPO | Admitting: Internal Medicine

## 2023-10-03 ENCOUNTER — Encounter: Payer: Self-pay | Admitting: Internal Medicine

## 2023-10-03 VITALS — BP 110/68 | HR 100 | Temp 98.6°F | Ht 64.5 in | Wt 167.0 lb

## 2023-10-03 DIAGNOSIS — E119 Type 2 diabetes mellitus without complications: Secondary | ICD-10-CM | POA: Diagnosis not present

## 2023-10-03 DIAGNOSIS — Z8659 Personal history of other mental and behavioral disorders: Secondary | ICD-10-CM | POA: Diagnosis not present

## 2023-10-03 DIAGNOSIS — J029 Acute pharyngitis, unspecified: Secondary | ICD-10-CM

## 2023-10-03 DIAGNOSIS — E782 Mixed hyperlipidemia: Secondary | ICD-10-CM | POA: Diagnosis not present

## 2023-10-03 DIAGNOSIS — H6691 Otitis media, unspecified, right ear: Secondary | ICD-10-CM | POA: Diagnosis not present

## 2023-10-03 MED ORDER — BENZONATATE 100 MG PO CAPS: 100.0000 mg | ORAL_CAPSULE | Freq: Three times a day (TID) | ORAL | 0 refills | Status: DC | PRN

## 2023-10-03 MED ORDER — AZITHROMYCIN 250 MG PO TABS: ORAL_TABLET | ORAL | 0 refills | Status: AC

## 2023-10-03 NOTE — Patient Instructions (Addendum)
You have tested negative for COVID-19 today.  Please take Zithromax Z-PAK 2 tabs day 1 followed by 1 tab days 2 through 5.  Rest at home and stay well-hydrated.  May take Tessalon Perles up to 3 times daily as needed for cough.  Contact us if symptoms worsen or fail to improve within just a few days.

## 2023-10-03 NOTE — Progress Notes (Addendum)
Patient Care Team: Margaree Mackintosh, MD as PCP - General (Internal Medicine)  Visit Date: 10/03/23  Subjective:    Patient ID: Natalie [name] , Female   DOB: 27-Oct-1946, 77 y.o.    MRN: [id number]   77 y.o. Female presents today for cough, sore throat, right ear pain upon swallowing. Denies sputum production. Reports negative Covid-19 test this morning. Denies known sick contact.  Past Medical History:  Diagnosis Date   Allergic rhinitis    Diabetes mellitus without complication (HCC)      Family History  Problem Relation Age of Onset   Lung disease Father        diffuse alveolar, following lung injury in MVA   Cancer Other    Breast cancer Maternal Grandmother     Social history: Married.  Has adult children and grandchildren.  Resides with husband.     Review of Systems  Constitutional:  Negative for fever and malaise/fatigue.  HENT:  Positive for ear pain (Right) and sore throat. Negative for congestion.   Eyes:  Negative for blurred vision.  Respiratory:  Positive for cough. Negative for shortness of breath.   Cardiovascular:  Negative for chest pain, palpitations and leg swelling.  Gastrointestinal:  Negative for vomiting.  Musculoskeletal:  Negative for back pain.  Skin:  Negative for rash.  Neurological:  Negative for loss of consciousness and headaches.        Objective:   Vitals: BP 110/68   Pulse 100   Temp 98.6 F (37 C)   Ht 5' 4.5" (1.638 m)   Wt 167 lb (75.8 kg)   SpO2 96%   BMI 28.22 kg/m    Physical Exam Vitals and nursing note reviewed.  Constitutional:      General: She is not in acute distress.    Appearance: Normal appearance. She is not toxic-appearing.  HENT:     Head: Normocephalic and atraumatic.     Right Ear: Hearing, ear canal and external ear normal.     Left Ear: Hearing, tympanic membrane, ear canal and external ear normal.     Ears:     Comments: Right TM dull, slightly full, not erythematous.     Mouth/Throat:     Comments: Pharynx injected without exudate. Pulmonary:     Effort: Pulmonary effort is normal. No respiratory distress.     Breath sounds: Normal breath sounds. No wheezing or rales.  Lymphadenopathy:     Cervical: No cervical adenopathy.  Skin:    General: Skin is warm and dry.  Neurological:     Mental Status: She is alert and oriented to person, place, and time. Mental status is at baseline.  Psychiatric:        Mood and Affect: Mood normal.        Behavior: Behavior normal.        Thought Content: Thought content normal.        Judgment: Judgment normal.       Results:   Studies obtained and personally reviewed by me:   Labs:       Component Value Date/Time   NA 141 07/26/2023 0918   K 4.5 07/26/2023 0918   CL 106 07/26/2023 0918   CO2 26 07/26/2023 0918   GLUCOSE 116 (H) 07/26/2023 0918   BUN 16 07/26/2023 0918   CREATININE 0.67 07/26/2023 0918   CALCIUM 9.6 07/26/2023 0918   PROT 7.3 07/26/2023 0918   ALBUMIN 4.5 06/27/2017 1000   AST 21 07/26/2023 7829  ALT 18 07/26/2023 0918   ALKPHOS 48 06/27/2017 1000   BILITOT 0.6 07/26/2023 0918   GFRNONAA 88 07/03/2020 0930   GFRAA 102 07/03/2020 0930     Lab Results  Component Value Date   WBC 6.0 07/26/2023   HGB 14.3 07/26/2023   HCT 43.4 07/26/2023   MCV 90.8 07/26/2023   PLT 189 07/26/2023    Lab Results  Component Value Date   CHOL 197 07/26/2023   HDL 46 (L) 07/26/2023   LDLCALC 119 (H) 07/26/2023   TRIG 206 (H) 07/26/2023   CHOLHDL 4.3 07/26/2023    Lab Results  Component Value Date   HGBA1C 6.1 (H) 07/26/2023     Lab Results  Component Value Date   TSH 2.09 07/26/2023      Assessment & Plan:   Acute upper respiratory infection: prescribed Z-Pak two tabs day 1 followed by one tab days 2-5. Tessalon perles 100 mg three times daily as needed for cough. Get plenty of rest and stay well-hydrated. Contact us if symptoms worsen or fail to improve.    Acute right serous  otitis media  Impaired glucose tolerance treated with Januvia  Mild hyperlipidemia treated with low-dose Crestor  I,Alexander Ruley,acting as a scribe for Margaree Mackintosh, MD.,have documented all relevant documentation on the behalf of Margaree Mackintosh, MD,as directed by  Margaree Mackintosh, MD while in the presence of Margaree Mackintosh, MD.   I, Margaree Mackintosh, MD, have reviewed all documentation for this visit. The documentation on 10/03/23 for the exam, diagnosis, procedures, and orders are all accurate and complete.

## 2023-10-05 ENCOUNTER — Other Ambulatory Visit: Payer: Self-pay | Admitting: Internal Medicine

## 2023-10-13 DIAGNOSIS — L821 Other seborrheic keratosis: Secondary | ICD-10-CM | POA: Diagnosis not present

## 2023-10-13 DIAGNOSIS — L579 Skin changes due to chronic exposure to nonionizing radiation, unspecified: Secondary | ICD-10-CM | POA: Diagnosis not present

## 2023-10-13 DIAGNOSIS — L814 Other melanin hyperpigmentation: Secondary | ICD-10-CM | POA: Diagnosis not present

## 2023-10-13 DIAGNOSIS — D225 Melanocytic nevi of trunk: Secondary | ICD-10-CM | POA: Diagnosis not present

## 2023-10-13 DIAGNOSIS — L720 Epidermal cyst: Secondary | ICD-10-CM | POA: Diagnosis not present

## 2023-10-18 ENCOUNTER — Other Ambulatory Visit: Payer: Self-pay | Admitting: Internal Medicine

## 2023-10-18 DIAGNOSIS — Z1231 Encounter for screening mammogram for malignant neoplasm of breast: Secondary | ICD-10-CM

## 2023-10-19 DIAGNOSIS — H52223 Regular astigmatism, bilateral: Secondary | ICD-10-CM | POA: Diagnosis not present

## 2023-10-19 DIAGNOSIS — H25813 Combined forms of age-related cataract, bilateral: Secondary | ICD-10-CM | POA: Diagnosis not present

## 2023-10-19 DIAGNOSIS — H3589 Other specified retinal disorders: Secondary | ICD-10-CM | POA: Diagnosis not present

## 2023-10-19 DIAGNOSIS — H524 Presbyopia: Secondary | ICD-10-CM | POA: Diagnosis not present

## 2023-10-19 DIAGNOSIS — Z83518 Family history of other specified eye disorder: Secondary | ICD-10-CM | POA: Diagnosis not present

## 2023-10-19 DIAGNOSIS — E119 Type 2 diabetes mellitus without complications: Secondary | ICD-10-CM | POA: Diagnosis not present

## 2023-10-19 DIAGNOSIS — H5203 Hypermetropia, bilateral: Secondary | ICD-10-CM | POA: Diagnosis not present

## 2023-10-19 DIAGNOSIS — Z83511 Family history of glaucoma: Secondary | ICD-10-CM | POA: Diagnosis not present

## 2023-10-19 LAB — HM DIABETES EYE EXAM

## 2023-11-04 ENCOUNTER — Encounter: Payer: Self-pay | Admitting: Internal Medicine

## 2023-11-22 ENCOUNTER — Ambulatory Visit
Admission: RE | Admit: 2023-11-22 | Discharge: 2023-11-22 | Disposition: A | Discharge status: Home / Self Care | Payer: PPO | Source: Ambulatory Visit | Attending: Internal Medicine | Admitting: Internal Medicine

## 2023-11-22 DIAGNOSIS — Z1231 Encounter for screening mammogram for malignant neoplasm of breast: Secondary | ICD-10-CM

## 2024-01-16 ENCOUNTER — Other Ambulatory Visit: Payer: PPO

## 2024-01-16 DIAGNOSIS — E119 Type 2 diabetes mellitus without complications: Secondary | ICD-10-CM

## 2024-01-17 LAB — HEMOGLOBIN A1C
Hgb A1c MFr Bld: 6.4 %{Hb} — ABNORMAL HIGH (ref <5.7)
Mean Plasma Glucose: 137 mg/dL
eAG (mmol/L): 7.6 mmol/L

## 2024-01-17 LAB — HEPATIC FUNCTION PANEL
AG Ratio: 1.7 (calc) (ref 1.0–2.5)
ALT: 21 U/L (ref 6–29)
AST: 23 U/L (ref 10–35)
Albumin: 4.5 g/dL (ref 3.6–5.1)
Alkaline phosphatase (APISO): 57 U/L (ref 37–153)
Bilirubin, Direct: 0.1 mg/dL (ref 0.0–0.2)
Globulin: 2.6 g/dL (ref 1.9–3.7)
Indirect Bilirubin: 0.4 mg/dL (ref 0.2–1.2)
Total Bilirubin: 0.5 mg/dL (ref 0.2–1.2)
Total Protein: 7.1 g/dL (ref 6.1–8.1)

## 2024-01-17 LAB — LIPID PANEL
Cholesterol: 178 mg/dL (ref <200)
HDL: 43 mg/dL — ABNORMAL LOW (ref >=50)
LDL Cholesterol (Calc): 102 mg/dL — ABNORMAL HIGH
Non-HDL Cholesterol (Calc): 135 mg/dL — ABNORMAL HIGH (ref <130)
Total CHOL/HDL Ratio: 4.1 (calc) (ref <5.0)
Triglycerides: 209 mg/dL — ABNORMAL HIGH (ref <150)

## 2024-01-20 ENCOUNTER — Other Ambulatory Visit: Payer: PPO

## 2024-01-23 ENCOUNTER — Telehealth: Payer: Self-pay | Admitting: Internal Medicine

## 2024-01-23 ENCOUNTER — Ambulatory Visit (INDEPENDENT_AMBULATORY_CARE_PROVIDER_SITE_OTHER): Payer: PPO | Admitting: Internal Medicine

## 2024-01-23 ENCOUNTER — Encounter: Payer: Self-pay | Admitting: Internal Medicine

## 2024-01-23 VITALS — BP 120/62 | HR 81 | Temp 98.9°F | Resp 12 | Ht 64.5 in | Wt 169.4 lb

## 2024-01-23 DIAGNOSIS — J069 Acute upper respiratory infection, unspecified: Secondary | ICD-10-CM

## 2024-01-23 DIAGNOSIS — E119 Type 2 diabetes mellitus without complications: Secondary | ICD-10-CM

## 2024-01-23 DIAGNOSIS — J029 Acute pharyngitis, unspecified: Secondary | ICD-10-CM

## 2024-01-23 DIAGNOSIS — M810 Age-related osteoporosis without current pathological fracture: Secondary | ICD-10-CM | POA: Diagnosis not present

## 2024-01-23 DIAGNOSIS — Z8659 Personal history of other mental and behavioral disorders: Secondary | ICD-10-CM

## 2024-01-23 DIAGNOSIS — E782 Mixed hyperlipidemia: Secondary | ICD-10-CM

## 2024-01-23 LAB — POCT RAPID STREP A (OFFICE): Rapid Strep A Screen: NEGATIVE

## 2024-01-23 MED ORDER — AZITHROMYCIN 250 MG PO TABS: ORAL_TABLET | ORAL | 0 refills | Status: AC

## 2024-01-23 MED ORDER — FENOFIBRATE 145 MG PO TABS: 145.0000 mg | ORAL_TABLET | Freq: Every day | ORAL | 1 refills | Status: DC

## 2024-01-23 MED ORDER — BENZONATATE 100 MG PO CAPS: 100.0000 mg | ORAL_CAPSULE | Freq: Three times a day (TID) | ORAL | 0 refills | Status: DC | PRN

## 2024-01-23 NOTE — Patient Instructions (Signed)
 We are sorry you are not feeling well today.  You have been diagnosed with an acute upper respiratory infection.  Please take Zithromax Z-PAK 2 tabs day 1 followed by 1 tab days 2 through 5 and Tessalon Perles 100 mg 3 times daily as needed for cough.  For chronic back pain continue Mobic 15 mg daily and Flexeril up to 3 times daily as needed.  Hemoglobin A1c is elevated at 6.4% from 6.1% in September.  Continue with Januvia 50 mg daily and watch diet more closely.  Triglycerides are elevated at 209.  LDL is 102.  Has not been taking rosuvastatin 5 mg daily due to concerns that this may precipitate glaucoma.  Patient is to take Crestor 5 mg daily with history of glucose intolerance I feel this is important.  She will also start Tricor 145 mg daily for hypertriglyceridemia.  Will recheck lipid panel in 6 to 8 weeks.  Currently not on any medication for anxiety.

## 2024-01-23 NOTE — Telephone Encounter (Signed)
 Copied from CRM [id number]. Topic: General - Other >> Jan 23, 2024  9:15 AM Gery Pray wrote: Reason for CRM: Patient calling to inform clinic that she has a runny nose but she does not have COVID. Patient stated that she took the test and it was negative.

## 2024-01-23 NOTE — Telephone Encounter (Signed)
 LVM to let patient know she could still come in just be sure to wear a mask.

## 2024-01-23 NOTE — Progress Notes (Signed)
 Patient Care Team: Margaree Mackintosh, MD as PCP - General (Internal Medicine) Wynelle Beckmann, OD (Optometry)  Visit Date: 01/23/24  Subjective:   Chief Complaint  Patient presents with   6 month follow up   Cough   Nasal Congestion   Patient WU:[id number] Natalie, [name] DOB:22-Sep-1946,Natalie y.o. NWG:[id number]   78 y.o. [name] presents today for 6 months follow-up for Impaired Glucose Tolerance; Mixed HLD; Chronic Back Pain. Patient has a past medical history of allergic rhinitis, glucose intolerance. Hx of hyperlipidemia currently diet controlled. Seen in this office 07/2023 for her annual visit, and a subsequent acute visit in December, in the interim Natalie [name] has seen Natalie [name] w/ Dermatology and Natalie [name] w/ Optometry in 10/2023.   Says that Natalie [name] isn't feeling very well today, with rhinorrhea and cough that started with a sore throat, and left ear pain that has resolved. Recently went to the beach, where it was windy and chilly with lots of pollen.   History of Chronic Back Pain treated with Mobic 15 mg daily, Flexeril 5-10 mg three times daily as needed.    History of Diabetes Mellitus, type II treated Januvia 50 mg daily with 01/16/2024 HgbA1c 6.4%, elevated from 6.1 in 07/2023 and 6.3% on 01/18/23.   History of Mixed Hyperlipidemia 01/16/2024 Lipid Panel w/ HDL 43, decreased from 46; Triglycerides 209, elevated from 206; LDL 102, decreased from 119. Is prescribed Rosuvastatin 5 mg, but says that Natalie [name] hasn't been taking this due to concern of correlation with development of glaucoma since her mother had this.    Reviewed 01/16/2024 Hepatic Function Panel: WNL.   Past Medical History:  Diagnosis Date   Allergic rhinitis    Diabetes mellitus without complication (HCC)     Not on File  Family History  Problem Relation Age of Onset   Lung disease Father        diffuse alveolar, following lung injury in MVA   Breast cancer Maternal Grandmother 65 - 69   Ovarian cancer Cousin     Breast cancer Cousin 68   Breast cancer Cousin Natalie   Cancer Other    Social Hx: married with adult children.   Review of Systems  Constitutional:  Negative for fever and malaise/fatigue.  HENT:  Positive for ear pain (left, resolved) and sore throat. Negative for congestion.        (+) Rhinorrhea  Eyes:  Negative for blurred vision.  Respiratory:  Positive for cough. Negative for shortness of breath.   Cardiovascular:  Negative for chest pain, palpitations and leg swelling.  Gastrointestinal:  Negative for vomiting.  Musculoskeletal:  Negative for back pain.  Skin:  Negative for rash.  Neurological:  Negative for loss of consciousness and headaches.     Objective:  Vitals: BP 120/62 (BP Location: Right Arm, Patient Position: Sitting, Cuff Size: Normal)   Pulse 81   Temp 98.9 F (37.2 C) (Tympanic)   Resp 12   Ht 5' 4.5" (1.638 m)   Wt 169 lb 6.4 oz (76.8 kg)   SpO2 98%   BMI 28.63 kg/m   Physical Exam Vitals and nursing note reviewed.  Constitutional:      General: Natalie [name] is not in acute distress.    Appearance: Normal appearance. Natalie [name] is not ill-appearing or toxic-appearing.  HENT:     Head: Normocephalic and atraumatic.     Right Ear: Tympanic membrane, ear canal and external ear normal.     Left Ear: Tympanic membrane, ear canal and external ear  normal. Tympanic membrane is not erythematous.     Ears:     Comments: Left TM slightly dull, not red.     Mouth/Throat:     Mouth: Mucous membranes are moist.     Pharynx: Oropharynx is clear. Posterior oropharyngeal erythema present. No oropharyngeal exudate.  Cardiovascular:     Rate and Rhythm: Normal rate and regular rhythm. No extrasystoles are present.    Pulses: Normal pulses.     Heart sounds: Normal heart sounds. No murmur heard.    No friction rub. No gallop.  Pulmonary:     Effort: Pulmonary effort is normal. No respiratory distress.     Breath sounds: Normal breath sounds. No wheezing, rhonchi or rales.   Lymphadenopathy:     Cervical: No cervical adenopathy.  Skin:    General: Skin is warm and dry.  Neurological:     Mental Status: Natalie [name] is alert and oriented to person, place, and time. Mental status is at baseline.  Psychiatric:        Mood and Affect: Mood normal.        Behavior: Behavior normal.        Thought Content: Thought content normal.        Judgment: Judgment normal.     Results:  Studies Obtained And Personally Reviewed By Me: Labs:     Component Value Date/Time   NA 141 07/26/2023 0918   K 4.5 07/26/2023 0918   CL 106 07/26/2023 0918   CO2 26 07/26/2023 0918   GLUCOSE 116 (H) 07/26/2023 0918   BUN 16 07/26/2023 0918   CREATININE 0.67 07/26/2023 0918   CALCIUM 9.6 07/26/2023 0918   PROT 7.1 01/16/2024 1239   ALBUMIN 4.5 06/27/2017 1000   AST 23 01/16/2024 1239   ALT 21 01/16/2024 1239   ALKPHOS 48 06/27/2017 1000   BILITOT 0.5 01/16/2024 1239   GFRNONAA 88 07/03/2020 0930   GFRAA 102 07/03/2020 0930    Lab Results  Component Value Date   WBC 6.0 07/26/2023   HGB 14.3 07/26/2023   HCT 43.4 07/26/2023   MCV 90.8 07/26/2023   PLT 189 07/26/2023   Lab Results  Component Value Date   CHOL 178 01/16/2024   HDL 43 (L) 01/16/2024   LDLCALC 102 (H) 01/16/2024   TRIG 209 (H) 01/16/2024   CHOLHDL 4.1 01/16/2024   Lab Results  Component Value Date   HGBA1C 6.4 (H) 01/16/2024    Lab Results  Component Value Date   TSH 2.09 07/26/2023   Results for orders placed or performed in visit on 01/23/24  POCT rapid strep A  Result Value Ref Range   Rapid Strep A Screen Negative Negative    Assessment & Plan:  Acute Upper Respiratory Infection: Strep NEG in office today. Sending in 250 mg Azithromycin - take 2 tablets on Day 1 and 1 tablet on Days 2-5 and 100 mg Tessalon Perles -  take 1 capsule (100 mg total) by mouth 3 (three) times daily as needed.   Chronic Back Pain treated with Mobic 15 mg daily, Flexeril 5-10 mg three times daily as needed.     Diabetes Mellitus, type II treated Januvia 50 mg daily with 01/16/2024 HgbA1c 6.4, elevated from 6.1 in 07/2023 and 6.3% on 01/18/23.   Mixed Hyperlipidemia 01/16/2024 Lipid Panel w/ HDL 43, decreased from 46; Triglycerides 209, elevated from 206; LDL 102, decreased from 119. Is prescribed Rosuvastatin 5 mg, but says that Natalie [name] hasn't been taking this due to concern of  correlation with development of glaucoma since her mother had this. Reassured about Crestor, and instructed to start Tricor 145 mg daily and Crestor 5 mg. Will recheck Lipid Panel in 6 weeks. Take Tricor in addition to Crestor.  Hx of anxiety- not on anti-anxiety med  Reviewed 01/16/2024 Hepatic Function Panel: WNL.  I,Emily Lagle,acting as a Neurosurgeon for Margaree Mackintosh, MD.,have documented all relevant documentation on the behalf of Margaree Mackintosh, MD,as directed by  Margaree Mackintosh, MD while in the presence of Margaree Mackintosh, MD.   I, Margaree Mackintosh, MD, have reviewed all documentation for this visit. The documentation on 01/23/24 for the exam, diagnosis, procedures, and orders are all accurate and complete.

## 2024-03-08 ENCOUNTER — Other Ambulatory Visit

## 2024-03-08 DIAGNOSIS — E782 Mixed hyperlipidemia: Secondary | ICD-10-CM

## 2024-03-08 LAB — LIPID PANEL
Cholesterol: 133 mg/dL (ref <200)
HDL: 42 mg/dL — ABNORMAL LOW (ref >=50)
LDL Cholesterol (Calc): 71 mg/dL
Non-HDL Cholesterol (Calc): 91 mg/dL (ref <130)
Total CHOL/HDL Ratio: 3.2 (calc) (ref <5.0)
Triglycerides: 116 mg/dL (ref <150)

## 2024-03-08 LAB — HEPATIC FUNCTION PANEL
AG Ratio: 1.7 (calc) (ref 1.0–2.5)
ALT: 25 U/L (ref 6–29)
AST: 31 U/L (ref 10–35)
Albumin: 4.5 g/dL (ref 3.6–5.1)
Alkaline phosphatase (APISO): 46 U/L (ref 37–153)
Bilirubin, Direct: 0.1 mg/dL (ref 0.0–0.2)
Globulin: 2.7 g/dL (ref 1.9–3.7)
Indirect Bilirubin: 0.4 mg/dL (ref 0.2–1.2)
Total Bilirubin: 0.5 mg/dL (ref 0.2–1.2)
Total Protein: 7.2 g/dL (ref 6.1–8.1)

## 2024-03-08 NOTE — Progress Notes (Signed)
 Patient Care Team: Sylvan Evener, MD as PCP - General (Internal Medicine) Ardelle Kos, OD (Optometry)  Visit Date: 03/12/24  Subjective:   Chief Complaint  Patient presents with  . Medical Management of Chronic Issues  Patient ZO:XWRUEAV Zynae, Herek DOB:[birth year],77 y.o. WUJ:[id number]   78 y.o. Female presents today for follow-up for Mixed Hyperlipidemia. Patient has a past medical history of Diabetes Mellitus, type II w/ HgbA1c 6.4 % on 3/17. 07/2022 Coronary Calcium  Score: 113. Seen for her 6 month f/u on 01/23/2024 with HDL 43, Triglycerides 209, and LDL 102, was started on Tricor  145 mg daily in addition to her Rosuvastatin  5 mg daily. 03/08/2024 Lipid Panel, compared to 3/17: HDL 42, decreased slightly from 43; Triglycerides are normal at 116; LDL normal at 71.   Reviewed 03/08/2024 Hepatic Function Panel which is also normal. Past Medical History:  Diagnosis Date  . Allergic rhinitis   . Diabetes mellitus without complication (HCC)   Not on File  Family History  Problem Relation Age of Onset  . Lung disease Father        diffuse alveolar, following lung injury in MVA  . Breast cancer Maternal Grandmother 60 - 69  . Ovarian cancer Cousin   . Breast cancer Cousin 38  . Breast cancer Cousin 28  . Cancer Other    Social hx: married. Enjoys family and travel. Social alcohol consumption. Nonsmoker.  Review of Systems  All other systems reviewed and are negative.    Objective:  Vitals: Ht 5' 4.5" (1.638 m)   BMI 28.63 kg/m   Physical Exam Vitals and nursing note reviewed.  Constitutional:      General: She is not in acute distress.    Appearance: Normal appearance. She is not toxic-appearing.  HENT:     Head: Normocephalic and atraumatic.  Cardiovascular:     Rate and Rhythm: Normal rate and regular rhythm. No extrasystoles are present.    Pulses: Normal pulses.     Heart sounds: Normal heart sounds. No murmur heard.    No friction rub. No gallop.   Pulmonary:     Effort: Pulmonary effort is normal. No respiratory distress.     Breath sounds: Normal breath sounds. No wheezing or rales.  Skin:    General: Skin is warm and dry.  Neurological:     Mental Status: She is alert and oriented to person, place, and time. Mental status is at baseline.  Psychiatric:        Mood and Affect: Mood normal.        Behavior: Behavior normal.        Thought Content: Thought content normal.        Judgment: Judgment normal.    Results:  Studies Obtained And Personally Reviewed By Me: Labs:     Component Value Date/Time   NA 141 07/26/2023 0918   K 4.5 07/26/2023 0918   CL 106 07/26/2023 0918   CO2 26 07/26/2023 0918   GLUCOSE 116 (H) 07/26/2023 0918   BUN 16 07/26/2023 0918   CREATININE 0.67 07/26/2023 0918   CALCIUM  9.6 07/26/2023 0918   PROT 7.2 03/08/2024 0937   ALBUMIN 4.5 06/27/2017 1000   AST 31 03/08/2024 0937   ALT 25 03/08/2024 0937   ALKPHOS 48 06/27/2017 1000   BILITOT 0.5 03/08/2024 0937   GFRNONAA 88 07/03/2020 0930   GFRAA 102 07/03/2020 0930    Lab Results  Component Value Date   WBC 6.0 07/26/2023   HGB 14.3 07/26/2023  HCT 43.4 07/26/2023   MCV 90.8 07/26/2023   PLT 189 07/26/2023   Lab Results  Component Value Date   CHOL 133 03/08/2024   HDL 42 (L) 03/08/2024   LDLCALC 71 03/08/2024   TRIG 116 03/08/2024   CHOLHDL 3.2 03/08/2024   Lab Results  Component Value Date   HGBA1C 6.4 (H) 01/16/2024    Lab Results  Component Value Date   TSH 2.09 07/26/2023    Assessment & Plan:   Orders Placed This Encounter  Procedures  . Hemoglobin A1c  Mixed Hyperlipidemia: 07/2022 Coronary Calcium  Score: 113. Seen for her 6 month f/u on 01/23/2024 with HDL 43, Triglycerides 209, and LDL 102, was started on Tricor  145 mg daily in addition to her Rosuvastatin  5 mg daily. 03/08/2024 Lipid Panel, compared to 3/17: HDL 42, decreased slightly from 43; Triglycerides normal at 116; LDL normal at 71.   Reviewed 03/08/2024  Hepatic Function Panel: normal.  Diabetes Mellitus, type II with HgbA1c 6.4% on 3/17. Ordered HgbA1c, specimen collected.  Return in 4 months (on 07/24/2024) for labs (DM, type II) and office visit on 07/30/2024.    I,Emily Lagle,acting as a Neurosurgeon for Sylvan Evener, MD.,have documented all relevant documentation on the behalf of Sylvan Evener, MD,as directed by  Sylvan Evener, MD while in the presence of Sylvan Evener, MD.   I, Sylvan Evener, MD, have reviewed all documentation for this visit. The documentation on 03/12/24 for the exam, diagnosis, procedures, and orders are all accurate and complete.

## 2024-03-12 ENCOUNTER — Encounter: Payer: Self-pay | Admitting: Internal Medicine

## 2024-03-12 ENCOUNTER — Ambulatory Visit: Admitting: Internal Medicine

## 2024-03-12 VITALS — BP 110/80 | HR 77 | Ht 64.5 in | Wt 169.0 lb

## 2024-03-12 DIAGNOSIS — E782 Mixed hyperlipidemia: Secondary | ICD-10-CM

## 2024-03-12 DIAGNOSIS — E119 Type 2 diabetes mellitus without complications: Secondary | ICD-10-CM

## 2024-03-12 NOTE — Patient Instructions (Addendum)
 Check Hgb AIC today. Convince patient to continue statin and Tricor . Follow up September. It was good to see you today. Continue to exercise.

## 2024-03-13 ENCOUNTER — Ambulatory Visit: Payer: Self-pay | Admitting: Internal Medicine

## 2024-03-13 LAB — HEMOGLOBIN A1C
Hgb A1c MFr Bld: 6.3 % — ABNORMAL HIGH (ref <5.7)
Mean Plasma Glucose: 134 mg/dL
eAG (mmol/L): 7.4 mmol/L

## 2024-03-21 ENCOUNTER — Encounter: Payer: Self-pay | Admitting: Internal Medicine

## 2024-03-21 ENCOUNTER — Ambulatory Visit (INDEPENDENT_AMBULATORY_CARE_PROVIDER_SITE_OTHER): Admitting: Internal Medicine

## 2024-03-21 VITALS — BP 110/80 | HR 73 | Ht 64.5 in | Wt 168.0 lb

## 2024-03-21 DIAGNOSIS — F419 Anxiety disorder, unspecified: Secondary | ICD-10-CM | POA: Diagnosis not present

## 2024-03-21 DIAGNOSIS — L509 Urticaria, unspecified: Secondary | ICD-10-CM

## 2024-03-21 DIAGNOSIS — R21 Rash and other nonspecific skin eruption: Secondary | ICD-10-CM | POA: Diagnosis not present

## 2024-03-21 MED ORDER — HYDROXYZINE PAMOATE 25 MG PO CAPS: ORAL_CAPSULE | ORAL | 0 refills | Status: DC

## 2024-03-21 MED ORDER — HYDROXYZINE PAMOATE 25 MG PO CAPS: 25.0000 mg | ORAL_CAPSULE | Freq: Three times a day (TID) | ORAL | 0 refills | Status: DC

## 2024-03-21 MED ORDER — METHYLPREDNISOLONE ACETATE 80 MG/ML IJ SUSP
80.0000 mg | Freq: Once | INTRAMUSCULAR | Status: AC
  Administered 2024-03-21: 80 mg via INTRAMUSCULAR

## 2024-03-21 NOTE — Progress Notes (Signed)
 Natalie [name]

## 2024-03-21 NOTE — Progress Notes (Signed)
 Patient Care Team: Sylvan Evener, MD as PCP - General (Internal Medicine) Ardelle Kos, OD (Optometry)  Visit Date: 03/21/24  Subjective:  Patient NW:[id number] Natalie [name], Natalie [name] DOB:1946-02-21,78 y.o. YQM:[id number]  Chief Complaint  Patient presents with   Rash  78 y.o.Female presents today for acute visit with Rash - generalized erythematous pruritic papular rash affecting trunk (back, abdomen, and chest) and extremities (upper and lower), but not her face. Denies shortness of breath or wheezing. Says that she has been dealing with some situational stress, which could have triggered onset Past Medical History:  Diagnosis Date   Allergic rhinitis    Diabetes mellitus without complication (HCC)   Not on File Family History  Problem Relation Age of Onset   Lung disease Father        diffuse alveolar, following lung injury in MVA   Breast cancer Maternal Grandmother 28 - 69   Ovarian cancer Cousin    Breast cancer Cousin 38   Breast cancer Cousin 55   Cancer Other    Social History   Social History Narrative   Regular exercise- no   Review of Systems  Respiratory:  Negative for shortness of breath and wheezing.   Skin:  Positive for itching and rash.   Objective:  Vitals: BP 110/80   Pulse 73   Ht 5' 4.5" (1.638 m)   Wt 168 lb (76.2 kg)   SpO2 97%   BMI 28.39 kg/m  Physical Exam Vitals and nursing note reviewed.  Constitutional:      General: She is not in acute distress.    Appearance: Normal appearance. She is not toxic-appearing.  HENT:     Head: Normocephalic and atraumatic.  Pulmonary:     Effort: Pulmonary effort is normal.  Skin:    General: Skin is warm and dry.     Findings: Rash present. Rash is papular.     Comments: Photograph obtained and attached of rash -   Neurological:     Mental Status: She is alert and oriented to person, place, and time. Mental status is at baseline.  Psychiatric:        Mood and Affect: Mood normal.         Behavior: Behavior normal.        Thought Content: Thought content normal.        Judgment: Judgment normal.     Results:  Studies Obtained And Personally Reviewed By Me: Labs:     Component Value Date/Time   NA 141 07/26/2023 0918   K 4.5 07/26/2023 0918   CL 106 07/26/2023 0918   CO2 26 07/26/2023 0918   GLUCOSE 116 (H) 07/26/2023 0918   BUN 16 07/26/2023 0918   CREATININE 0.67 07/26/2023 0918   CALCIUM  9.6 07/26/2023 0918   PROT 7.2 03/08/2024 0937   ALBUMIN 4.5 06/27/2017 1000   AST 31 03/08/2024 0937   ALT 25 03/08/2024 0937   ALKPHOS 48 06/27/2017 1000   BILITOT 0.5 03/08/2024 0937   GFRNONAA 88 07/03/2020 0930   GFRAA 102 07/03/2020 0930    Lab Results  Component Value Date   WBC 6.0 07/26/2023   HGB 14.3 07/26/2023   HCT 43.4 07/26/2023   MCV 90.8 07/26/2023   PLT 189 07/26/2023   Lab Results  Component Value Date   CHOL 133 03/08/2024   HDL 42 (L) 03/08/2024   LDLCALC 71 03/08/2024   TRIG 116 03/08/2024   CHOLHDL 3.2 03/08/2024   Lab Results  Component Value Date  HGBA1C 6.3 (H) 03/12/2024    Lab Results  Component Value Date   TSH 2.09 07/26/2023   Assessment & Plan:   Meds ordered this encounter  Medications   methylPREDNISolone acetate (DEPO-MEDROL) injection 80 mg   DISCONTD: hydrOXYzine (VISTARIL) 25 MG capsule    Sig: Take 1 capsule (25 mg total) by mouth 3 (three) times daily.    Dispense:  30 capsule    Refill:  0   hydrOXYzine (VISTARIL) 25 MG capsule    Sig: One tablet  by mouth 3 times daily as needed for itching    Dispense:  30 capsule    Refill:  0   Urticaria: Given Depo-Medrol 80 mg IM today in-office. Sending in Hydroxyzine 25 mg to take three times daily x10 days  Situational Stress discussed.      I,Emily Lagle,acting as a Neurosurgeon for Sylvan Evener, MD.,have documented all relevant documentation on the behalf of Sylvan Evener, MD,as directed by  Sylvan Evener, MD while in the presence of Sylvan Evener, MD.   I,  Sylvan Evener, MD, have reviewed all documentation for this visit. The documentation on 03/21/24 for the exam, diagnosis, procedures, and orders are all accurate and complete.

## 2024-03-30 ENCOUNTER — Other Ambulatory Visit: Payer: Self-pay | Admitting: Internal Medicine

## 2024-04-16 ENCOUNTER — Other Ambulatory Visit: Payer: Self-pay | Admitting: Internal Medicine

## 2024-04-27 ENCOUNTER — Other Ambulatory Visit: Payer: Self-pay | Admitting: Internal Medicine

## 2024-07-24 ENCOUNTER — Other Ambulatory Visit

## 2024-07-24 DIAGNOSIS — M810 Age-related osteoporosis without current pathological fracture: Secondary | ICD-10-CM

## 2024-07-24 DIAGNOSIS — E119 Type 2 diabetes mellitus without complications: Secondary | ICD-10-CM

## 2024-07-24 DIAGNOSIS — Z0000 Encounter for general adult medical examination without abnormal findings: Secondary | ICD-10-CM | POA: Diagnosis not present

## 2024-07-24 DIAGNOSIS — E782 Mixed hyperlipidemia: Secondary | ICD-10-CM | POA: Diagnosis not present

## 2024-07-24 DIAGNOSIS — R7302 Impaired glucose tolerance (oral): Secondary | ICD-10-CM

## 2024-07-25 LAB — COMPREHENSIVE METABOLIC PANEL WITH GFR
AG Ratio: 1.9 (calc) (ref 1.0–2.5)
ALT: 20 U/L (ref 6–29)
AST: 25 U/L (ref 10–35)
Albumin: 4.6 g/dL (ref 3.6–5.1)
Alkaline phosphatase (APISO): 41 U/L (ref 37–153)
BUN: 20 mg/dL (ref 7–25)
CO2: 27 mmol/L (ref 20–32)
Calcium: 9.5 mg/dL (ref 8.6–10.4)
Chloride: 108 mmol/L (ref 98–110)
Creat: 0.71 mg/dL (ref 0.60–1.00)
Globulin: 2.4 g/dL (ref 1.9–3.7)
Glucose, Bld: 130 mg/dL — ABNORMAL HIGH (ref 65–99)
Potassium: 4.2 mmol/L (ref 3.5–5.3)
Sodium: 143 mmol/L (ref 135–146)
Total Bilirubin: 0.5 mg/dL (ref 0.2–1.2)
Total Protein: 7 g/dL (ref 6.1–8.1)
eGFR: 88 mL/min/1.73m2 (ref >=60)

## 2024-07-25 LAB — HEMOGLOBIN A1C
Hgb A1c MFr Bld: 6.2 % — ABNORMAL HIGH (ref <5.7)
Mean Plasma Glucose: 131 mg/dL
eAG (mmol/L): 7.3 mmol/L

## 2024-07-25 LAB — CBC WITH DIFFERENTIAL/PLATELET
Absolute Lymphocytes: 1607 {cells}/uL (ref 850–3900)
Absolute Monocytes: 617 {cells}/uL (ref 200–950)
Basophils Absolute: 69 {cells}/uL (ref 0–200)
Basophils Relative: 1.1 %
Eosinophils Absolute: 208 {cells}/uL (ref 15–500)
Eosinophils Relative: 3.3 %
HCT: 40.7 % (ref 35.0–45.0)
Hemoglobin: 13.6 g/dL (ref 11.7–15.5)
MCH: 30.6 pg (ref 27.0–33.0)
MCHC: 33.4 g/dL (ref 32.0–36.0)
MCV: 91.7 fL (ref 80.0–100.0)
MPV: 10.3 fL (ref 7.5–12.5)
Monocytes Relative: 9.8 %
Neutro Abs: 3799 {cells}/uL (ref 1500–7800)
Neutrophils Relative %: 60.3 %
Platelets: 213 Thousand/uL (ref 140–400)
RBC: 4.44 Million/uL (ref 3.80–5.10)
RDW: 13 % (ref 11.0–15.0)
Total Lymphocyte: 25.5 %
WBC: 6.3 Thousand/uL (ref 3.8–10.8)

## 2024-07-25 LAB — LIPID PANEL
Cholesterol: 137 mg/dL (ref <200)
HDL: 45 mg/dL — ABNORMAL LOW (ref >=50)
LDL Cholesterol (Calc): 69 mg/dL
Non-HDL Cholesterol (Calc): 92 mg/dL (ref <130)
Total CHOL/HDL Ratio: 3 (calc) (ref <5.0)
Triglycerides: 144 mg/dL (ref <150)

## 2024-07-25 LAB — MICROALBUMIN / CREATININE URINE RATIO
Creatinine, Urine: 253 mg/dL (ref 20–275)
Microalb Creat Ratio: 6 mg/g{creat} (ref <30)
Microalb, Ur: 1.6 mg/dL

## 2024-07-25 LAB — TSH: TSH: 1.79 m[IU]/L (ref 0.40–4.50)

## 2024-07-26 ENCOUNTER — Other Ambulatory Visit: Payer: Self-pay | Admitting: Internal Medicine

## 2024-07-27 NOTE — Progress Notes (Signed)
 Annual Wellness Visit   Patient Care Team: Tanya Crothers, Ronal PARAS, MD as PCP - General (Internal Medicine) [place] Rogelia BROCKS, OD (Optometry)  Visit Date: 07/30/24   Chief Complaint  Patient presents with   Medicare Wellness   Annual Exam   Subjective:  Patient: Natalie [name], Female DOB: [birth year], 78 y.o. MRN: [id number] Vitals:   07/30/24 1000  BP: 110/80   Natalie [name] is a 78 y.o. Female who presents today for her Annual Wellness Visit. Patient has a history of allergic rhinitis, Type 2 diabetes mellitus, hyperlipidemia, osteoporosis.    She says she's doing fine and that she recently went to a football game.     History of Diabetes Mellitus type II Treated with Januvia  50 mg. 07/24/2024 HgbA1c 6.2% decreased from 03/12/2024 HgbA1c 6.3%.     Diabetic Foot exam normal    History of Hyperlipidemia treated with Rosuvastatin  5 mg. 07/24/2024 Lipid panel HDL 45 otherwise WNL.   History of osteoporosis in the right femoral neck with bone density study in 2019 showing T-score -3.1.  Has not wanted to be on Prolia or Boniva    She was hospitalized in 2012 with a bout of gastroenteritis resulting in partial small bowel obstruction.  Was treated with IV fluids, bowel rest and subsequently obstruction resolved.   Past medical history includes anxiety related to airplane flight.  History of fractured foot secondary to a fall in 1986.  Right shoulder adhesive capsulitis November 2010.  History of vaginal hysterectomy with enterocele and rectocele repair in 2004.  Labs 07/24/2024  Blood glucose 130, HgbA1c 6.2, HDL 45 Otherwise WNL     11/22/2023 Mammogram No mammographic evidence of malignancy. Repeat in one year.     07/22/22 CT cardiac score 113    03/18/2022 Colonoscopy Hemmorrhoids found in perianal exam, Diverticulosis in the sigmoid colon, A single non bleeding angioectasia. The examination was otherwise normal on direct and retroflexion views. No specimens  collected. Repeat in 10 years.     Vaccination Counseling: Influenza vaccine received today,  Covid-19 vaccine declined.   Health Maintenance  Topic Date Due   COVID-19 Vaccine (5 - 2025-26 season) 08/15/2024 (Originally 07/02/2024)   Hepatitis C Screening  10/02/2024 (Originally 06/03/1964)   Zoster Vaccines- Shingrix (1 of 2) 10/24/2024 (Originally 06/03/1998)   OPHTHALMOLOGY EXAM  10/18/2024   HEMOGLOBIN A1C  01/21/2025   Diabetic kidney evaluation - eGFR measurement  07/24/2025   Diabetic kidney evaluation - Urine ACR  07/24/2025   FOOT EXAM  07/30/2025   Medicare Annual Wellness (AWV)  07/30/2025   DTaP/Tdap/Td (3 - Td or Tdap) 01/19/2033   Pneumococcal Vaccine: 50+ Years  Completed   Influenza Vaccine  Completed   DEXA SCAN  Completed   HPV VACCINES  Aged Out   Meningococcal B Vaccine  Aged Out   Mammogram  Discontinued   Colonoscopy  Discontinued     Review of Systems  Constitutional:  Negative for fever and malaise/fatigue.  HENT:  Negative for congestion.   Eyes:  Negative for blurred vision.  Respiratory:  Negative for cough and shortness of breath.   Cardiovascular:  Negative for chest pain, palpitations and leg swelling.  Gastrointestinal:  Negative for vomiting.  Musculoskeletal:  Negative for back pain.  Skin:  Negative for rash.  Neurological:  Negative for loss of consciousness and headaches.   Objective:  Vitals: body mass index is 28.1 kg/m. Today's Vitals   07/30/24 1000  BP: 110/80  Pulse: 70  SpO2: 97%  Weight: 165 lb (74.8 kg)  Height: 5' 4.25 (1.632 m)   Physical Exam Vitals and nursing note reviewed. Exam conducted with a chaperone present (Natalie [name], CMA).  Constitutional:      General: She is not in acute distress.    Appearance: Normal appearance. She is not ill-appearing or toxic-appearing.  HENT:     Head: Normocephalic and atraumatic.     Right Ear: Hearing, tympanic membrane, ear canal and external ear normal.     Left Ear:  Hearing, tympanic membrane, ear canal and external ear normal.     Mouth/Throat:     Pharynx: Oropharynx is clear.  Eyes:     Extraocular Movements: Extraocular movements intact.     Pupils: Pupils are equal, round, and reactive to light.  Neck:     Thyroid : No thyroid  mass, thyromegaly or thyroid  tenderness.     Vascular: No carotid bruit.  Cardiovascular:     Rate and Rhythm: Normal rate and regular rhythm. No extrasystoles are present.    Pulses:          Dorsalis pedis pulses are 2+ on the right side and 2+ on the left side.     Heart sounds: Normal heart sounds. No murmur heard.    No friction rub. No gallop.  Pulmonary:     Effort: Pulmonary effort is normal.     Breath sounds: Normal breath sounds. No decreased breath sounds, wheezing, rhonchi or rales.  Chest:     Chest wall: No mass.  Abdominal:     Palpations: Abdomen is soft. There is no hepatomegaly, splenomegaly or mass.     Tenderness: There is no abdominal tenderness.     Hernia: No hernia is present.  Musculoskeletal:     Cervical back: Normal range of motion.     Right lower leg: No edema.     Left lower leg: No edema.  Lymphadenopathy:     Cervical: No cervical adenopathy.     Upper Body:     Right upper body: No supraclavicular adenopathy.     Left upper body: No supraclavicular adenopathy.  Skin:    General: Skin is warm and dry.  Neurological:     General: No focal deficit present.     Mental Status: She is alert and oriented to person, place, and time. Mental status is at baseline.     Sensory: Sensation is intact.     Motor: Motor function is intact. No weakness.     Deep Tendon Reflexes: Reflexes are normal and symmetric.  Psychiatric:        Attention and Perception: Attention normal.        Mood and Affect: Mood normal.        Speech: Speech normal.        Behavior: Behavior normal.        Thought Content: Thought content normal.        Cognition and Memory: Cognition normal.        Judgment:  Judgment normal.     Current Outpatient Medications  Medication Instructions   calcium  citrate-vitamin D  (CITRACAL+D) 315-200 MG-UNIT per tablet 1 tablet, Daily   Cholecalciferol (VITAMIN D3) 1000 UNITS CAPS Daily   fenofibrate  (TRICOR ) 145 mg, Oral, Daily   hydrOXYzine  (VISTARIL ) 25 MG capsule One tablet  by mouth 3 times daily as needed for itching   Multiple Vitamins-Minerals (CENTRUM ADULTS PO) Take by mouth.   rosuvastatin  (CRESTOR ) 5 MG tablet TAKE 1 TABLET BY MOUTH 3 TIMES A  WEEK WITH SUPPER   sitaGLIPtin  (JANUVIA ) 100 mg, Oral, Daily   Past Medical History:  Diagnosis Date   Allergic rhinitis    Diabetes mellitus without complication [facility])    Medical/Surgical History Narrative:  Allergic/Intolerant to: Not on File  Past Surgical History:  Procedure Laterality Date   ABDOMINAL HYSTERECTOMY     for dysfunctional bleeding 2002, Dr.Lomax   Family History  Problem Relation Age of Onset   Lung disease Father        diffuse alveolar, following lung injury in MVA   Breast cancer Maternal Grandmother 93 - 69   Ovarian cancer Cousin    Breast cancer Cousin 52   Breast cancer Cousin 9   Cancer Other    Family History Narrative: Father died at age 1 due to diffuse alveolar damage of the lung. Mother died at age 41 with complications of pneumonia. 1 sister in good health.  Social History   Social History Narrative   Regular exercise- no   She is married. 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 She does not smoke. Social alcohol consumption. She has a Naval architect. She has not worked outside the home since 1996. 2 adult children. Husband has cardiac issues. They enjoy traveling and going to [place]  [employer] ballgames.  Most Recent Health Risks Assessment:   Most Recent Social Determinants of Health (Including Hx of Tobacco, Alcohol, and Drug Use) SDOH Screenings   Food Insecurity: No Food Insecurity (07/28/2023)  Housing: Low Risk  (07/28/2023)   Transportation Needs: No Transportation Needs (07/28/2023)  Utilities: Not At Risk (07/28/2023)  Alcohol Screen: Low Risk  (07/10/2020)  Depression (PHQ2-9): Low Risk  (07/30/2024)  Financial Resource Strain: Low Risk  (07/28/2023)  Physical Activity: Sufficiently Active (07/28/2023)  Social Connections: Socially Integrated (07/28/2023)  Stress: No Stress Concern Present (07/28/2023)  Tobacco Use: Low Risk  (07/30/2024)  Health Literacy: Adequate Health Literacy (07/28/2023)   Social History   Tobacco Use   Smoking status: Never   Smokeless tobacco: Never  Substance Use Topics   Alcohol use: Yes    Comment: socially   Drug use: No   Most Recent Functional Status Assessment:    07/30/2024    9:59 AM  In your present state of health, do you have any difficulty performing the following activities:  Hearing? 0  Vision? 0  Difficulty concentrating or making decisions? 0  Walking or climbing stairs? 0  Dressing or bathing? 0  Doing errands, shopping? 0  Preparing Food and eating ? N  Using the Toilet? N  In the past six months, have you accidently leaked urine? N  Do you have problems with loss of bowel control? N  Managing your Medications? N  Managing your Finances? N  Housekeeping or managing your Housekeeping? N   Most Recent Fall Risk Assessment:    07/30/2024   10:00 AM  Fall Risk   Falls in the past year? 0  Number falls in past yr: 0  Injury with Fall? 0  Risk for fall due to : No Fall Risks  Follow up Falls prevention discussed;Falls evaluation completed;Education provided   Most Recent Anxiety/Depression Screenings:    07/30/2024   10:00 AM 07/28/2023   10:12 AM  PHQ 2/9 Scores  PHQ - 2 Score 0 0    Most Recent Cognitive Screening:    07/30/2024    9:59 AM  6CIT Screen  What Year? 0 points  What month? 0 points  What time? 0 points  Count back from 20 0 points  Months in reverse 0 points  Repeat phrase 0 points  Total Score 0 points   Most Recent  Vision/Hearing Screenings:No results found. Results:  Studies Obtained And Personally Reviewed By Me:  11/22/2023 Mammogram No mammographic evidence of malignancy. Repeat in one year.     07/22/22 CT cardiac score 113.    03/18/2022 Colonoscopy Hemmorrhoids found in perianal exam, Diverticulosis in the sigmoid colon, A single non bleeding angioectasia. The examination was otherwise normal on direct and retroflexion views. No specimens collected. Repeat in 10 years.     Labs:  CBC w/ Differential Lab Results  Component Value Date   WBC 6.3 07/24/2024   RBC 4.44 07/24/2024   HGB 13.6 07/24/2024   HCT 40.7 07/24/2024   PLT 213 07/24/2024   MCV 91.7 07/24/2024   MCH 30.6 07/24/2024   MCHC 33.4 07/24/2024   RDW 13.0 07/24/2024   MPV 10.3 07/24/2024   LYMPHSABS 1,716 07/26/2023   MONOABS 567 06/27/2017   BASOSABS 69 07/24/2024    Comprehensive Metabolic Panel Lab Results  Component Value Date   NA 143 07/24/2024   K 4.2 07/24/2024   CL 108 07/24/2024   CO2 27 07/24/2024   GLUCOSE 130 (H) 07/24/2024   BUN 20 07/24/2024   CREATININE 0.71 07/24/2024   CALCIUM  9.5 07/24/2024   PROT 7.0 07/24/2024   ALBUMIN 4.5 06/27/2017   AST 25 07/24/2024   ALT 20 07/24/2024   ALKPHOS 48 06/27/2017   BILITOT 0.5 07/24/2024   EGFR 88 07/24/2024   GFRNONAA 88 07/03/2020   Lipid Panel  Lab Results  Component Value Date   CHOL 137 07/24/2024   HDL 45 (L) 07/24/2024   LDLCALC 69 07/24/2024   TRIG 144 07/24/2024   A1c Lab Results  Component Value Date   HGBA1C 6.2 (H) 07/24/2024    TSH Lab Results  Component Value Date   TSH 1.79 07/24/2024    Assessment & Plan:   Meds ordered this encounter  Medications   sitaGLIPtin  (JANUVIA ) 100 MG tablet    Sig: Take 1 tablet (100 mg total) by mouth daily.    Dispense:  90 tablet    Refill:  0    Diabetes Mellitus, Type II: Treated with Januvia  50 mg. 07/24/2024 HgbA1c 6.2% decreased from 03/12/2024 HgbA1c 6.3.%    Januvia  increased  to 100 mg daily. May finish current RX- take 2 of current medication before picking up new prescription.    Diabetic Foot exam is completely normal   Hyperlipidemia: treated with Rosuvastatin  5 mg. 07/24/2024 Lipid panel  shows HDL 45 otherwise WNL.   History of osteoporosis in the right femoral neck with bone density study in 2019 showing T-score -3.1.  Has not wanted to be on Prolia or Boniva.    11/22/2023 Mammogram No mammographic evidence of malignancy. Repeat in one year.     07/22/22 CT cardiac score 113.    03/18/2022 Colonoscopy Hemmorrhoids found in perianal exam, Diverticulosis in the sigmoid colon, A single non bleeding angioectasia. The examination was otherwise normal on direct and retroflexion views. No specimens collected. Repeat in 10 years.     Vaccination Counseling: Influenza vaccine received today, Covid-19 vaccine declined .     Return in about 6 weeks (around 09/10/2024). To follow up on Hgb AIC with increased dose of Januvia    Annual Wellness Visit done today including the all of the following: Reviewed patient's Family Medical History Reviewed patient's SDOH and reviewed tobacco, alcohol,  and drug use.  Reviewed and updated list of patient's medical providers Assessment of cognitive impairment was done Assessed patient's functional ability Established a written schedule for health screening services Health Risk Assessent Completed and Reviewed  Discussed health benefits of physical activity, and encouraged her to engage in regular exercise appropriate for her age and condition.    I,Natalie [name],acting as a scribe for Ronal JINNY Hailstone, MD.,have documented all relevant documentation on the behalf of Ronal JINNY Hailstone, MD,as directed by  Ronal JINNY Hailstone, MD while in the presence of Ronal JINNY Hailstone, MD.   I, Ronal JINNY Hailstone, MD, have reviewed all documentation for and agree with the above Annual Wellness Visit documentation.  Ronal JINNY Hailstone, MD Internal  Medicine 07/30/2024

## 2024-07-30 ENCOUNTER — Encounter: Payer: Self-pay | Admitting: Internal Medicine

## 2024-07-30 ENCOUNTER — Ambulatory Visit: Admitting: Internal Medicine

## 2024-07-30 VITALS — BP 110/80 | HR 70 | Ht 64.25 in | Wt 165.0 lb

## 2024-07-30 DIAGNOSIS — Z7984 Long term (current) use of oral hypoglycemic drugs: Secondary | ICD-10-CM

## 2024-07-30 DIAGNOSIS — M810 Age-related osteoporosis without current pathological fracture: Secondary | ICD-10-CM | POA: Diagnosis not present

## 2024-07-30 DIAGNOSIS — Z8659 Personal history of other mental and behavioral disorders: Secondary | ICD-10-CM | POA: Diagnosis not present

## 2024-07-30 DIAGNOSIS — E119 Type 2 diabetes mellitus without complications: Secondary | ICD-10-CM | POA: Diagnosis not present

## 2024-07-30 DIAGNOSIS — Z0000 Encounter for general adult medical examination without abnormal findings: Secondary | ICD-10-CM | POA: Diagnosis not present

## 2024-07-30 DIAGNOSIS — E782 Mixed hyperlipidemia: Secondary | ICD-10-CM | POA: Diagnosis not present

## 2024-07-30 DIAGNOSIS — Z23 Encounter for immunization: Secondary | ICD-10-CM | POA: Diagnosis not present

## 2024-07-30 MED ORDER — SITAGLIPTIN PHOSPHATE 100 MG PO TABS: 100.0000 mg | ORAL_TABLET | Freq: Every day | ORAL | 0 refills | Status: DC

## 2024-07-30 NOTE — Patient Instructions (Addendum)
 Increase Januvia  to 100 mg daily and follow up with Hgb AIC and OV in 6 weeks.  It was a pleasure to see you today.  Return for hemoglobin A1c and office visit in 6 weeks.

## 2024-09-10 NOTE — Progress Notes (Signed)
 Patient Care Team: Perri Ronal PARAS, MD as PCP - General (Internal Medicine) [place] Rogelia BROCKS, OD (Optometry)  Visit Date: 09/13/24  Subjective:    Patient ID: Natalie [name] , Female   DOB: 1946/10/31, 78 y.o.    MRN: [id number]   78 y.o. Female presents today for 6 week follow up Diabetes Mellitus, Type II,  Patient has a past medical history of Hyperlipidemia, osteoporosis, Allergic Rhinitis.  History of Diabetes Mellitus, Type II treated with Januvia  100 mg daily.  09/11/2024 HgbA1c 6.3%. She said that she has not been eating healthy but will change her diet going forward. She has expressed that she does want to take any more medication or see an endocrinologist.    History of Hyperlipemia treated with Rosuvastatin  5 mg daily and Fenofibrate  145 mg daily.       Past Medical History:  Diagnosis Date   Allergic rhinitis    Diabetes mellitus without complication (HCC)      Family History  Problem Relation Age of Onset   Lung disease Father        diffuse alveolar, following lung injury in MVA   Breast cancer Maternal Grandmother 1 - 69   Ovarian cancer Cousin    Breast cancer Cousin 85   Breast cancer Cousin 54   Cancer Other     Social History   Social History Narrative   Regular exercise- no    Married- has adult children. Nonsmoker. Social alcohol consumption.   Review of Systems  All other systems reviewed and are negative.       Objective:   Vitals: BP 100/80   Pulse 85   Ht 5' 4.5 (1.638 m)   Wt 166 lb (75.3 kg)   SpO2 96%   BMI 28.05 kg/m    Physical Exam Vitals and nursing note reviewed.  Constitutional:      General: She is not in acute distress.    Appearance: Normal appearance. She is not toxic-appearing.  HENT:     Head: Normocephalic and atraumatic.  Cardiovascular:     Rate and Rhythm: Normal rate and regular rhythm. No extrasystoles are present.    Pulses: Normal pulses.     Heart sounds: Normal heart sounds. No murmur  heard.    No friction rub. No gallop.  Pulmonary:     Effort: Pulmonary effort is normal. No respiratory distress.     Breath sounds: Normal breath sounds. No wheezing or rales.  Skin:    General: Skin is warm and dry.  Neurological:     Mental Status: She is alert and oriented to person, place, and time. Mental status is at baseline.  Psychiatric:        Mood and Affect: Mood normal.        Behavior: Behavior normal.        Thought Content: Thought content normal.        Judgment: Judgment normal.       Results:    Labs:       Component Value Date/Time   NA 143 07/24/2024 0931   K 4.2 07/24/2024 0931   CL 108 07/24/2024 0931   CO2 27 07/24/2024 0931   GLUCOSE 130 (H) 07/24/2024 0931   BUN 20 07/24/2024 0931   CREATININE 0.71 07/24/2024 0931   CALCIUM  9.5 07/24/2024 0931   PROT 7.0 07/24/2024 0931   ALBUMIN 4.5 06/27/2017 1000   AST 25 07/24/2024 0931   ALT 20 07/24/2024 0931   ALKPHOS 48  06/27/2017 1000   BILITOT 0.5 07/24/2024 0931   GFRNONAA 88 07/03/2020 0930   GFRAA 102 07/03/2020 0930     Lab Results  Component Value Date   WBC 6.3 07/24/2024   HGB 13.6 07/24/2024   HCT 40.7 07/24/2024   MCV 91.7 07/24/2024   PLT 213 07/24/2024    Lab Results  Component Value Date   CHOL 137 07/24/2024   HDL 45 (L) 07/24/2024   LDLCALC 69 07/24/2024   TRIG 144 07/24/2024   CHOLHDL 3.0 07/24/2024    Lab Results  Component Value Date   HGBA1C 6.3 (H) 09/11/2024     Lab Results  Component Value Date   TSH 1.79 07/24/2024        Assessment & Plan:   Diabetes Mellitus, Type II: treated with Januvia  100 mg daily.  09/11/2024 HgbA1c 6.3%. She said that she has not been eating healthy but says will change her diet going forward. She has expressed that she does want to take any more medication or see an Actor.     Agreed to amend diet and to HgbA1c recheck in February of next year.    Mixed Hyperlipemia: treated with Rosuvastatin  5 mg daily and  Fenofibrate  145 mg daily. Lipid panel in Sept normal except for low HDL which is longstanding. Recommend regular exercise to help raise HDL.  Osteoporosis- T score in 2019  of right femoral neck -3.1. She will not consider bisphosphonate therapy. Continue Vitamin D  supplement.   I,Makayla C Reid,acting as a scribe for Ronal JINNY Hailstone, MD.,have documented all relevant documentation on the behalf of Ronal JINNY Hailstone, MD,as directed by  Ronal JINNY Hailstone, MD while in the presence of Ronal JINNY Hailstone, MD.   I, Ronal JINNY Hailstone, MD, have reviewed all documentation for this visit. The documentation on 09/13/2024 for the exam, diagnosis, procedures, and orders are all accurate and complete.

## 2024-09-11 ENCOUNTER — Other Ambulatory Visit: Payer: Self-pay

## 2024-09-11 DIAGNOSIS — E119 Type 2 diabetes mellitus without complications: Secondary | ICD-10-CM

## 2024-09-12 LAB — HEMOGLOBIN A1C
Hgb A1c MFr Bld: 6.3 % — ABNORMAL HIGH (ref <5.7)
Mean Plasma Glucose: 134 mg/dL
eAG (mmol/L): 7.4 mmol/L

## 2024-09-13 ENCOUNTER — Encounter: Payer: Self-pay | Admitting: Internal Medicine

## 2024-09-13 ENCOUNTER — Ambulatory Visit: Payer: Self-pay | Admitting: Internal Medicine

## 2024-09-13 VITALS — BP 100/80 | HR 85 | Ht 64.5 in | Wt 166.0 lb

## 2024-09-13 DIAGNOSIS — E782 Mixed hyperlipidemia: Secondary | ICD-10-CM

## 2024-09-13 DIAGNOSIS — M810 Age-related osteoporosis without current pathological fracture: Secondary | ICD-10-CM | POA: Diagnosis not present

## 2024-09-13 DIAGNOSIS — Z7984 Long term (current) use of oral hypoglycemic drugs: Secondary | ICD-10-CM

## 2024-09-13 DIAGNOSIS — E119 Type 2 diabetes mellitus without complications: Secondary | ICD-10-CM

## 2024-09-23 NOTE — Patient Instructions (Signed)
 Continue Vitamin D  supplement for osteoporosis. Does not want to be on bisphosphonate therapy. Watch diet and try to get regular exercise. Check Hgb AIC again in February 2026. Continue rosuvastatin  and fenofibrate  for mixed hyperlipidemia.

## 2024-10-18 ENCOUNTER — Encounter: Payer: Self-pay | Admitting: Internal Medicine

## 2024-10-18 ENCOUNTER — Other Ambulatory Visit: Payer: Self-pay | Admitting: Internal Medicine

## 2024-10-18 ENCOUNTER — Ambulatory Visit: Admitting: Internal Medicine

## 2024-10-18 VITALS — BP 120/80 | HR 76 | Wt 165.0 lb

## 2024-10-18 DIAGNOSIS — Z800 Family history of malignant neoplasm of digestive organs: Secondary | ICD-10-CM | POA: Diagnosis not present

## 2024-10-18 DIAGNOSIS — F411 Generalized anxiety disorder: Secondary | ICD-10-CM | POA: Diagnosis not present

## 2024-10-18 DIAGNOSIS — Z1231 Encounter for screening mammogram for malignant neoplasm of breast: Secondary | ICD-10-CM

## 2024-10-18 NOTE — Progress Notes (Shared)
 Patient Care Team: Perri Ronal PARAS, MD as PCP - General (Internal Medicine) [place] Rogelia BROCKS, OD (Optometry)  Visit Date: 10/18/2024  Subjective:    Patient ID: Natalie [name] , Female   DOB: 09/06/1946, 78 y.o.    MRN: [id number]   79 y.o. Female presents today for  Genetic testing. Patient has a past medical history of Diabetes mellitus, Type II, Hyperlipidemia, osteoporosis.  Her sister was recently diagnosed with pancreatic cancer and is currently undergoing treatment and she was inquiring about getting genetic testing to see if she has the potential to get it. She has had another family member that died from pancreatic cancer.    Past Medical History:  Diagnosis Date   Allergic rhinitis    Diabetes mellitus without complication (HCC)      Family History  Problem Relation Age of Onset   Lung disease Father        diffuse alveolar, following lung injury in MVA   Breast cancer Maternal Grandmother 50 - 69   Ovarian cancer Cousin    Breast cancer Cousin 19   Breast cancer Cousin 55   Cancer Other     Social History   Social History Narrative   Regular exercise- no   Married- has adult children. Nonsmoker. Social alcohol consumption.     Review of Systems  All other systems reviewed and are negative.       Objective:   Vitals: BP 120/80   Pulse 76   Wt 165 lb (74.8 kg)   SpO2 97%   BMI 27.88 kg/m    Physical Exam Vitals and nursing note reviewed.       Results:     Labs:       Component Value Date/Time   NA 143 07/24/2024 0931   K 4.2 07/24/2024 0931   CL 108 07/24/2024 0931   CO2 27 07/24/2024 0931   GLUCOSE 130 (H) 07/24/2024 0931   BUN 20 07/24/2024 0931   CREATININE 0.71 07/24/2024 0931   CALCIUM  9.5 07/24/2024 0931   PROT 7.0 07/24/2024 0931   ALBUMIN 4.5 06/27/2017 1000   AST 25 07/24/2024 0931   ALT 20 07/24/2024 0931   ALKPHOS 48 06/27/2017 1000   BILITOT 0.5 07/24/2024 0931   GFRNONAA 88 07/03/2020 0930   GFRAA  102 07/03/2020 0930     Lab Results  Component Value Date   WBC 6.3 07/24/2024   HGB 13.6 07/24/2024   HCT 40.7 07/24/2024   MCV 91.7 07/24/2024   PLT 213 07/24/2024    Lab Results  Component Value Date   CHOL 137 07/24/2024   HDL 45 (L) 07/24/2024   LDLCALC 69 07/24/2024   TRIG 144 07/24/2024   CHOLHDL 3.0 07/24/2024    Lab Results  Component Value Date   HGBA1C 6.3 (H) 09/11/2024     Lab Results  Component Value Date   TSH 1.79 07/24/2024        Assessment & Plan:   Orders Placed This Encounter  Procedures   Ambulatory referral to Hematology / Oncology    Referral Priority:   Routine    Referral Type:   Consultation    Referral Reason:   Other    Requested Specialty:   Oncology    Number of Visits Requested:   1    Family history of pancreatic cancer: Her sister was recently diagnosed with pancreatic cancer and is currently undergoing treatment and she was inquiring about getting genetic testing to see if  she has the potential to get it. She has had another family member that died from pancreatic cancer.    Referred to Hematology/Oncology.     I,Makayla C Reid,acting as a scribe for Ronal JINNY Hailstone, MD.,have documented all relevant documentation on the behalf of Ronal JINNY Hailstone, MD,as directed by  Ronal JINNY Hailstone, MD while in the presence of Ronal JINNY Hailstone, MD.

## 2024-10-21 NOTE — Patient Instructions (Addendum)
 Please take Xanax  for anxiety as prescribed. We will place referral for Genetic Consultation about your risk for pancreatic cancer given your family history.

## 2024-10-26 ENCOUNTER — Other Ambulatory Visit: Payer: Self-pay | Admitting: Internal Medicine

## 2024-10-29 ENCOUNTER — Telehealth: Payer: Self-pay | Admitting: Internal Medicine

## 2024-10-29 MED ORDER — AZITHROMYCIN 250 MG PO TABS: ORAL_TABLET | ORAL | 0 refills | Status: AC

## 2024-10-29 NOTE — Telephone Encounter (Signed)
 Send in Zithromax  Z pak for upcoming trip to [place] . MJb, MD

## 2024-11-13 ENCOUNTER — Other Ambulatory Visit: Payer: Self-pay | Admitting: Internal Medicine

## 2024-11-22 ENCOUNTER — Ambulatory Visit

## 2024-11-22 ENCOUNTER — Ambulatory Visit
Admission: RE | Admit: 2024-11-22 | Discharge: 2024-11-22 | Disposition: A | Discharge status: Home / Self Care | Source: Ambulatory Visit | Attending: Internal Medicine | Admitting: Internal Medicine

## 2024-11-22 ENCOUNTER — Encounter: Payer: Self-pay | Admitting: Internal Medicine

## 2024-11-22 DIAGNOSIS — Z1231 Encounter for screening mammogram for malignant neoplasm of breast: Secondary | ICD-10-CM

## 2024-12-04 NOTE — Progress Notes (Incomplete)
 "   Patient Care Team: Perri Ronal PARAS, MD as PCP - General (Internal Medicine) [place] Rogelia BROCKS, OD (Optometry)  Visit Date: 12/04/24  Subjective:    Patient ID: Natalie [name] , Female   DOB: Feb 07, 1946, 79 y.o.    MRN: [id number]   79 y.o. Female presents today for 3 month follow up for ***. Patient has a past medical history of ***.  History of Diabetes Mellitus, Type II treated with Januvia  100 mg daily.  09/11/2024 HgbA1c 6.3%. She said that she has not been eating healthy but will change her diet going forward. She has expressed that she does want to take any more medication or see an endocrinologist.     History of Hyperlipemia treated with Rosuvastatin  5 mg daily and Fenofibrate  145 mg daily.     Past Medical History:  Diagnosis Date   Allergic rhinitis    Diabetes mellitus without complication (HCC)      Family History  Problem Relation Age of Onset   Lung disease Father        diffuse alveolar, following lung injury in MVA   Breast cancer Maternal Grandmother 7 - 69   Ovarian cancer Cousin    Breast cancer Cousin 40   Breast cancer Cousin 48   Cancer Other     Social History   Social History Narrative   Regular exercise- no   She is married. 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 She does not smoke. Social alcohol consumption. She has a naval architect. She has not worked outside the home since 1996. 2 adult children. Husband has cardiac issues. They enjoy traveling and going to [place]  [employer] ballgames.     ROS      Objective:   Vitals: There were no vitals taken for this visit.   Physical Exam    Results:   Studies obtained and personally reviewed by me:  Imaging, colonoscopy, mammogram, bone density scan, echocardiogram, heart cath, stress test, CT calcium  score, etc. ***   Labs:       Component Value Date/Time   NA 143 07/24/2024 0931   K 4.2 07/24/2024 0931   CL 108 07/24/2024 0931   CO2 27 07/24/2024 0931    GLUCOSE 130 (H) 07/24/2024 0931   BUN 20 07/24/2024 0931   CREATININE 0.71 07/24/2024 0931   CALCIUM  9.5 07/24/2024 0931   PROT 7.0 07/24/2024 0931   ALBUMIN 4.5 06/27/2017 1000   AST 25 07/24/2024 0931   ALT 20 07/24/2024 0931   ALKPHOS 48 06/27/2017 1000   BILITOT 0.5 07/24/2024 0931   GFRNONAA 88 07/03/2020 0930   GFRAA 102 07/03/2020 0930     Lab Results  Component Value Date   WBC 6.3 07/24/2024   HGB 13.6 07/24/2024   HCT 40.7 07/24/2024   MCV 91.7 07/24/2024   PLT 213 07/24/2024    Lab Results  Component Value Date   CHOL 137 07/24/2024   HDL 45 (L) 07/24/2024   LDLCALC 69 07/24/2024   TRIG 144 07/24/2024   CHOLHDL 3.0 07/24/2024    Lab Results  Component Value Date   HGBA1C 6.3 (H) 09/11/2024     Lab Results  Component Value Date   TSH 1.79 07/24/2024     No results found for: PSA1, PSA *** delete for female pts  ***    Assessment & Plan:   ***    I,Makayla C Reid,acting as a scribe for Ronal PARAS Perri, MD.,have documented all relevant documentation on  the behalf of Ronal JINNY Hailstone, MD,as directed by  Ronal JINNY Hailstone, MD while in the presence of Ronal JINNY Hailstone, MD.   ***   "

## 2024-12-10 ENCOUNTER — Inpatient Hospital Stay

## 2024-12-17 ENCOUNTER — Other Ambulatory Visit

## 2024-12-18 ENCOUNTER — Ambulatory Visit: Admitting: Internal Medicine
